# Patient Record
Sex: Female | Born: 1940 | Race: White | Hispanic: No | Marital: Married | Smoking: Never smoker
Health system: Southern US, Community
[De-identification: ages and names within clinical notes are randomized; demographics above are authoritative.]

## PROBLEM LIST (undated history)

## (undated) DIAGNOSIS — M1990 Unspecified osteoarthritis, unspecified site: Secondary | ICD-10-CM

## (undated) DIAGNOSIS — E039 Hypothyroidism, unspecified: Secondary | ICD-10-CM

## (undated) DIAGNOSIS — E119 Type 2 diabetes mellitus without complications: Secondary | ICD-10-CM

## (undated) HISTORY — PX: APPENDECTOMY: SHX54

## (undated) HISTORY — PX: COLONOSCOPY: SHX174

---

## 1982-01-06 HISTORY — PX: LAPAROSCOPY: SHX197

## 1998-10-25 ENCOUNTER — Encounter: Payer: Self-pay | Admitting: Family Medicine

## 1998-10-25 ENCOUNTER — Ambulatory Visit (HOSPITAL_COMMUNITY): Admission: RE | Admit: 1998-10-25 | Discharge: 1998-10-25 | Payer: Self-pay | Admitting: Family Medicine

## 1999-06-04 ENCOUNTER — Encounter: Admission: RE | Admit: 1999-06-04 | Discharge: 1999-06-04 | Payer: Self-pay | Admitting: Family Medicine

## 1999-06-04 ENCOUNTER — Encounter: Payer: Self-pay | Admitting: Family Medicine

## 2000-07-24 ENCOUNTER — Encounter: Payer: Self-pay | Admitting: Family Medicine

## 2000-07-24 ENCOUNTER — Encounter: Admission: RE | Admit: 2000-07-24 | Discharge: 2000-07-24 | Payer: Self-pay | Admitting: Family Medicine

## 2000-10-26 ENCOUNTER — Encounter: Payer: Self-pay | Admitting: Family Medicine

## 2000-10-26 ENCOUNTER — Encounter: Admission: RE | Admit: 2000-10-26 | Discharge: 2000-10-26 | Payer: Self-pay | Admitting: Family Medicine

## 2001-07-26 ENCOUNTER — Encounter: Admission: RE | Admit: 2001-07-26 | Discharge: 2001-07-26 | Payer: Self-pay | Admitting: Family Medicine

## 2001-07-26 ENCOUNTER — Encounter: Payer: Self-pay | Admitting: Family Medicine

## 2002-08-17 ENCOUNTER — Encounter: Payer: Self-pay | Admitting: Family Medicine

## 2002-08-17 ENCOUNTER — Encounter: Admission: RE | Admit: 2002-08-17 | Discharge: 2002-08-17 | Payer: Self-pay | Admitting: Family Medicine

## 2003-08-29 ENCOUNTER — Encounter: Admission: RE | Admit: 2003-08-29 | Discharge: 2003-08-29 | Payer: Self-pay | Admitting: Family Medicine

## 2004-02-01 ENCOUNTER — Other Ambulatory Visit: Admission: RE | Admit: 2004-02-01 | Discharge: 2004-02-01 | Payer: Self-pay | Admitting: Family Medicine

## 2004-09-16 ENCOUNTER — Encounter: Admission: RE | Admit: 2004-09-16 | Discharge: 2004-09-16 | Payer: Self-pay | Admitting: Family Medicine

## 2005-02-03 ENCOUNTER — Encounter: Admission: RE | Admit: 2005-02-03 | Discharge: 2005-02-03 | Payer: Self-pay | Admitting: Family Medicine

## 2005-09-19 ENCOUNTER — Encounter: Admission: RE | Admit: 2005-09-19 | Discharge: 2005-09-19 | Payer: Self-pay | Admitting: Family Medicine

## 2006-09-21 ENCOUNTER — Encounter: Admission: RE | Admit: 2006-09-21 | Discharge: 2006-09-21 | Payer: Self-pay | Admitting: Family Medicine

## 2007-02-08 ENCOUNTER — Other Ambulatory Visit: Admission: RE | Admit: 2007-02-08 | Discharge: 2007-02-08 | Payer: Self-pay | Admitting: Family Medicine

## 2007-10-04 ENCOUNTER — Encounter: Admission: RE | Admit: 2007-10-04 | Discharge: 2007-10-04 | Payer: Self-pay | Admitting: Family Medicine

## 2008-10-19 ENCOUNTER — Encounter: Admission: RE | Admit: 2008-10-19 | Discharge: 2008-10-19 | Payer: Self-pay | Admitting: Family Medicine

## 2008-10-20 ENCOUNTER — Encounter: Admission: RE | Admit: 2008-10-20 | Discharge: 2008-10-20 | Payer: Self-pay | Admitting: Family Medicine

## 2009-10-22 ENCOUNTER — Encounter: Admission: RE | Admit: 2009-10-22 | Discharge: 2009-10-22 | Payer: Self-pay | Admitting: Family Medicine

## 2010-02-21 ENCOUNTER — Other Ambulatory Visit: Payer: Self-pay | Admitting: Family Medicine

## 2010-02-21 DIAGNOSIS — R1031 Right lower quadrant pain: Secondary | ICD-10-CM

## 2010-02-22 ENCOUNTER — Ambulatory Visit
Admission: RE | Admit: 2010-02-22 | Discharge: 2010-02-22 | Disposition: A | Discharge status: Home / Self Care | Payer: MEDICARE | Source: Ambulatory Visit | Attending: Family Medicine | Admitting: Family Medicine

## 2010-02-22 DIAGNOSIS — R1031 Right lower quadrant pain: Secondary | ICD-10-CM

## 2010-02-22 MED ORDER — IOHEXOL 300 MG/ML  SOLN
100.0000 mL | Freq: Once | INTRAMUSCULAR | Status: AC | PRN
  Administered 2010-02-22: 100 mL via INTRAVENOUS

## 2010-10-09 ENCOUNTER — Other Ambulatory Visit: Payer: Self-pay | Admitting: Family Medicine

## 2010-10-09 DIAGNOSIS — Z1231 Encounter for screening mammogram for malignant neoplasm of breast: Secondary | ICD-10-CM

## 2010-10-24 ENCOUNTER — Ambulatory Visit
Admission: RE | Admit: 2010-10-24 | Discharge: 2010-10-24 | Disposition: A | Discharge status: Home / Self Care | Payer: Medicare Other | Source: Ambulatory Visit | Attending: Family Medicine | Admitting: Family Medicine

## 2010-10-24 DIAGNOSIS — Z1231 Encounter for screening mammogram for malignant neoplasm of breast: Secondary | ICD-10-CM

## 2011-02-24 ENCOUNTER — Other Ambulatory Visit (HOSPITAL_COMMUNITY)
Admission: RE | Admit: 2011-02-24 | Discharge: 2011-02-24 | Disposition: A | Discharge status: Home / Self Care | Payer: Medicare Other | Source: Ambulatory Visit | Attending: Family Medicine | Admitting: Family Medicine

## 2011-02-24 ENCOUNTER — Other Ambulatory Visit: Payer: Self-pay | Admitting: Family Medicine

## 2011-02-24 DIAGNOSIS — Z124 Encounter for screening for malignant neoplasm of cervix: Secondary | ICD-10-CM | POA: Insufficient documentation

## 2011-10-14 ENCOUNTER — Other Ambulatory Visit: Payer: Self-pay | Admitting: Family Medicine

## 2011-10-14 DIAGNOSIS — Z1231 Encounter for screening mammogram for malignant neoplasm of breast: Secondary | ICD-10-CM

## 2011-10-27 ENCOUNTER — Ambulatory Visit
Admission: RE | Admit: 2011-10-27 | Discharge: 2011-10-27 | Disposition: A | Discharge status: Home / Self Care | Payer: Medicare Other | Source: Ambulatory Visit | Attending: Family Medicine | Admitting: Family Medicine

## 2011-10-27 DIAGNOSIS — Z1231 Encounter for screening mammogram for malignant neoplasm of breast: Secondary | ICD-10-CM

## 2012-10-25 ENCOUNTER — Other Ambulatory Visit: Payer: Self-pay

## 2012-10-25 DIAGNOSIS — Z1231 Encounter for screening mammogram for malignant neoplasm of breast: Secondary | ICD-10-CM

## 2012-11-19 ENCOUNTER — Ambulatory Visit: Payer: Medicare Other

## 2012-11-23 ENCOUNTER — Ambulatory Visit
Admission: RE | Admit: 2012-11-23 | Discharge: 2012-11-23 | Disposition: A | Discharge status: Home / Self Care | Payer: Medicare Other | Source: Ambulatory Visit

## 2012-11-23 DIAGNOSIS — Z1231 Encounter for screening mammogram for malignant neoplasm of breast: Secondary | ICD-10-CM

## 2013-11-03 ENCOUNTER — Other Ambulatory Visit: Payer: Self-pay

## 2013-11-03 DIAGNOSIS — Z1239 Encounter for other screening for malignant neoplasm of breast: Secondary | ICD-10-CM

## 2013-11-24 ENCOUNTER — Ambulatory Visit
Admission: RE | Admit: 2013-11-24 | Discharge: 2013-11-24 | Disposition: A | Discharge status: Home / Self Care | Payer: Medicare Other | Source: Ambulatory Visit

## 2013-11-24 DIAGNOSIS — Z1239 Encounter for other screening for malignant neoplasm of breast: Secondary | ICD-10-CM

## 2014-10-25 ENCOUNTER — Other Ambulatory Visit: Payer: Self-pay

## 2014-10-25 DIAGNOSIS — Z1231 Encounter for screening mammogram for malignant neoplasm of breast: Secondary | ICD-10-CM

## 2014-11-27 ENCOUNTER — Ambulatory Visit
Admission: RE | Admit: 2014-11-27 | Discharge: 2014-11-27 | Disposition: A | Discharge status: Home / Self Care | Payer: Medicare Other | Source: Ambulatory Visit

## 2014-11-27 DIAGNOSIS — Z1231 Encounter for screening mammogram for malignant neoplasm of breast: Secondary | ICD-10-CM

## 2015-11-07 ENCOUNTER — Other Ambulatory Visit: Payer: Self-pay | Admitting: Family Medicine

## 2015-11-07 DIAGNOSIS — Z1231 Encounter for screening mammogram for malignant neoplasm of breast: Secondary | ICD-10-CM

## 2015-11-12 ENCOUNTER — Other Ambulatory Visit: Payer: Self-pay | Admitting: Orthopaedic Surgery

## 2015-11-15 ENCOUNTER — Encounter (HOSPITAL_COMMUNITY)
Admission: RE | Admit: 2015-11-15 | Discharge: 2015-11-15 | Disposition: A | Discharge status: Home / Self Care | Payer: Medicare Other | Source: Ambulatory Visit | Attending: Orthopaedic Surgery | Admitting: Orthopaedic Surgery

## 2015-11-15 ENCOUNTER — Encounter (HOSPITAL_COMMUNITY): Payer: Self-pay

## 2015-11-15 DIAGNOSIS — M4184 Other forms of scoliosis, thoracic region: Secondary | ICD-10-CM | POA: Insufficient documentation

## 2015-11-15 DIAGNOSIS — Z01818 Encounter for other preprocedural examination: Secondary | ICD-10-CM | POA: Insufficient documentation

## 2015-11-15 HISTORY — DX: Type 2 diabetes mellitus without complications: E11.9

## 2015-11-15 HISTORY — DX: Unspecified osteoarthritis, unspecified site: M19.90

## 2015-11-15 HISTORY — DX: Hypothyroidism, unspecified: E03.9

## 2015-11-15 LAB — GLUCOSE, CAPILLARY: GLUCOSE-CAPILLARY: 115 mg/dL — AB (ref 65–99)

## 2015-11-15 LAB — CBC WITH DIFFERENTIAL/PLATELET
Basophils Absolute: 0 10*3/uL (ref 0.0–0.1)
Basophils Relative: 1 %
Eosinophils Absolute: 0.2 10*3/uL (ref 0.0–0.7)
Eosinophils Relative: 2 %
HEMATOCRIT: 39.6 % (ref 36.0–46.0)
HEMOGLOBIN: 13.2 g/dL (ref 12.0–15.0)
LYMPHS ABS: 3.1 10*3/uL (ref 0.7–4.0)
Lymphocytes Relative: 40 %
MCH: 28.6 pg (ref 26.0–34.0)
MCHC: 33.3 g/dL (ref 30.0–36.0)
MCV: 85.7 fL (ref 78.0–100.0)
MONO ABS: 0.5 10*3/uL (ref 0.1–1.0)
MONOS PCT: 7 %
NEUTROS ABS: 3.9 10*3/uL (ref 1.7–7.7)
NEUTROS PCT: 50 %
Platelets: 282 10*3/uL (ref 150–400)
RBC: 4.62 MIL/uL (ref 3.87–5.11)
RDW: 14.2 % (ref 11.5–15.5)
WBC: 7.7 10*3/uL (ref 4.0–10.5)

## 2015-11-15 LAB — BASIC METABOLIC PANEL
ANION GAP: 7 (ref 5–15)
BUN: 13 mg/dL (ref 6–20)
CALCIUM: 9.7 mg/dL (ref 8.9–10.3)
CHLORIDE: 102 mmol/L (ref 101–111)
CO2: 26 mmol/L (ref 22–32)
CREATININE: 0.69 mg/dL (ref 0.44–1.00)
GFR calc non Af Amer: >60 mL/min (ref >60)
GLUCOSE: 97 mg/dL (ref 65–99)
Potassium: 3.8 mmol/L (ref 3.5–5.1)
Sodium: 135 mmol/L (ref 135–145)

## 2015-11-15 LAB — PROTIME-INR
INR: 0.99
Prothrombin Time: 13.1 seconds (ref 11.4–15.2)

## 2015-11-15 LAB — SURGICAL PCR SCREEN
MRSA, PCR: NEGATIVE
STAPHYLOCOCCUS AUREUS: NEGATIVE

## 2015-11-15 LAB — URINALYSIS, ROUTINE W REFLEX MICROSCOPIC
Bilirubin Urine: NEGATIVE
Glucose, UA: NEGATIVE mg/dL
Hgb urine dipstick: NEGATIVE
KETONES UR: NEGATIVE mg/dL
LEUKOCYTES UA: NEGATIVE
NITRITE: NEGATIVE
PROTEIN: NEGATIVE mg/dL
Specific Gravity, Urine: 1.008 (ref 1.005–1.030)
pH: 6.5 (ref 5.0–8.0)

## 2015-11-15 LAB — TYPE AND SCREEN
ABO/RH(D): O NEG
Antibody Screen: NEGATIVE

## 2015-11-15 LAB — APTT: aPTT: 27 seconds (ref 24–36)

## 2015-11-15 LAB — ABO/RH: ABO/RH(D): O NEG

## 2015-11-15 NOTE — Pre-Procedure Instructions (Addendum)
Danielle Fields  11/15/2015      [id number] - [address], [place] - [address] [name] [name] [address] Phone: [phone] Fax: [phone]    Your procedure is scheduled on Tues. Nov. 21  [facility]:30 [facility].M.  Call this number if you have problems the morning of surgery:  [phone]   Remember:  Do not eat food or drink liquids after midnight on Mon. Nov. 20  Take these medicines the morning of surgery with A SIP OF WATER: levothyroxine (synthroid)              Stop 1 week prior to surgery: aspirin, advil, motrin, ibubrofen,fish oil, vitamins/herbal medicines.    How to Manage Your Diabetes Before and After Surgery  Why is it important to control my blood sugar before and after surgery? . Improving blood sugar levels before and after surgery helps healing and can limit problems. . A way of improving blood sugar control is eating a healthy diet by: o  Eating less sugar and carbohydrates o  Increasing activity/exercise o  Talking with your doctor about reaching your blood sugar goals . High blood sugars (greater than 180 mg/dL) can raise your risk of infections and slow your recovery, so you will need to focus on controlling your diabetes during the weeks before surgery. . Make sure that the doctor who takes care of your diabetes knows about your planned surgery including the date and location.  How do I manage my blood sugar before surgery? . Check your blood sugar at least 4 times a day, starting 2 days before surgery, to make sure that the level is not too high or low. o Check your blood sugar the morning of your surgery when you wake up and every 2 hours until you get to the Short Stay unit. . If your blood sugar is less than 70 mg/dL, you will need to treat for low blood sugar: o Do not take insulin. o Treat a low blood sugar (less than 70 mg/dL) with  cup of clear juice (cranberry or apple),  4 glucose tablets, OR glucose gel. o Recheck blood sugar in 15 minutes after treatment (to make sure it is greater than 70 mg/dL). If your blood sugar is not greater than 70 mg/dL on recheck, call [phone] for further instructions. . Report your blood sugar to the short stay nurse when you get to Short Stay.  . If you are admitted to the hospital after surgery: o Your blood sugar will be checked by the staff and you will probably be given insulin after surgery (instead of oral diabetes medicines) to make sure you have good blood sugar levels. o The goal for blood sugar control after surgery is 80-180 mg/dL.    Do not wear jewelry, make-up or nail polish.  Do not wear lotions, powders, or perfumes, or deoderant.  Do not shave 48 hours prior to surgery.  Men may shave face and neck.  Do not bring valuables to the hospital.  [facility] is not responsible for any belongings or valuables.  Contacts, dentures or bridgework may not be worn into surgery.  Leave your suitcase in the car.  After surgery it may be brought to your room.  For patients admitted to the hospital, discharge time will be determined by your treatment team.  Patients discharged the day of surgery will not be allowed to drive home.  Special instructions: review preparing for surgery  Please read over the following fact sheets that you were given. Coughing and Deep Breathing and MRSA Information

## 2015-11-15 NOTE — Progress Notes (Signed)
PCP:dr. Blair Heysobert Ehinger @ [facility]-- will request Hgb A1C  Fasting sugars 100

## 2015-11-19 NOTE — Progress Notes (Addendum)
Anesthesia Chart Review: Patient is a 75 year old female scheduled for right THA, anterior approach on 11/27/15 by Dr. Jerl Santosalldorf. Case is posted for spinal.  History includes non-smoker, DM2, hypothyroidism, appendectomy. Family history of CAD (mother, brother, sister).  PCP is Dr. Blair Heysobert Ehinger, last visit 10/24/15.  Meds include ASA 81 mg, Calcium, Vitamin D, levothyroxine, omega-3 fatty acids, Livalo.   BP (!) 142/74   Pulse 77   Temp 36.7 C   Resp 20   Ht 5\' 5"  (1.651 m)   Wt 136 lb 11.2 oz (62 kg)   SpO2 95%   BMI 22.75 kg/m   11/15/15 EKG: NSR, ST/T wave abnormality, consider anterior ischemia. There are no comparison tracings in [facility] Epic, Muse, or at Dr. Randel BooksEhinger's office.   11/15/15 CXR: FINDINGS: Cardiomediastinal silhouette is unremarkable. Dextroscoliosis of thoracic spine. Mild degenerative changes thoracic spine. No infiltrate or pulmonary edema. IMPRESSION: No active cardiopulmonary disease.  Thoracic dextroscoliosis.  Preoperative labs noted. UA WNL. A1c on 10/26/15 (PCP) was 6.0.  Discussed above with anesthesiologist Dr. Sandford Craze. Jackson. With abnormal EKG, DM and family history of CAD would recommend preoperative cardiology evaluation. Olegario MessierKathy at Dr. Nolon Nationsalldorf's office notified.  Velna Ochsllison Elaya Droege, PA-C [facility]/Anesthesiology Phone [phone] 11/19/2015 4:46 PM   Addendum: Patient was seen by Dr. Eden EmmsNishan for preoperative cardiology evaluation on 11/22/15. He wrote, "Abnormal ECG doubt ischemia as she has never had vascular disease and has No exertional symptoms No need for stress testing However will get echo today To r/o Non obstructive or apical HOCM.Marland Kitchen.Marland Kitchen.Hip clear to have right THR via anterior approach with Dr Jerl Santosalldorf Tuesday." Echo was unremarkable--no HOCM noted by report. He apparently was able to get an old EKG tracing from 1998 that showed SR, PAC, no biphasic anterolateral T waves. PRN cardiology follow-up recommended.  11/22/15 Echo: Study  Conclusions - Left ventricle: The cavity size was at the upper limits of   normal. Wall thickness was normal. Systolic function was normal.   The estimated ejection fraction was in the range of 55% to 60%.   Wall motion was normal; there were no regional wall motion   abnormalities. Doppler parameters are consistent with abnormal   left ventricular relaxation (grade 1 diastolic dysfunction). - Left atrium: The atrium was mildly dilated.  Velna Ochsllison Kaitlin Alcindor, PA-C [facility]/Anesthesiology Phone [phone] 11/23/2015 10:17 AM

## 2015-11-21 NOTE — Progress Notes (Signed)
Cardiology Office Note   Date:  11/22/2015   ID:  Danielle Fields, DOB [birth year], MRN [id number]  PCP:  Thora LanceEHINGER,ROBERT R, MD  Cardiologist:   Charlton HawsPeter Jonte Wollam, MD   Chief Complaint  Patient presents with  . Establish Care      History of Present Illness: Danielle Fields is a 75 y.o. female who presents for preop clearance. Needs right total hip with Dr Jerl Santosalldorf.  Preop ECG abnormal Done on 11/15/15 Biphasic T waves in precordium read by computer as anterolateral ischemia.  No old ECG to compare Diet controlled DM and elevated lipids on statin .     CXR 11/9 scoliosis no CE normal mediastinum   She is retired from [employer] Still active goes to [employer] 3x/week No chest pain Palpitations dyspnea or syncope Does not think Dr Manus GunningEhinger has done an ECG on her In years/ever. No family history of HOCM She has not had resistant HTN.  No history  Of connective tissue disease.    Past Medical History:  Diagnosis Date  . Arthritis   . Diabetes mellitus without complication (HCC)   . Hypothyroidism     Past Surgical History:  Procedure Laterality Date  . APPENDECTOMY    . COLONOSCOPY     x2  . LAPAROSCOPY  1984     Current Outpatient Prescriptions  Medication Sig Dispense Refill  . aspirin EC 81 MG tablet Take 81 mg by mouth daily.    Marland Kitchen. CALCIUM PO Take 1 capsule by mouth 2 (two) times daily.    . cholecalciferol (VITAMIN D) 1000 units tablet Take 1,000 Units by mouth daily.    Marland Kitchen. co-enzyme Q-10 30 MG capsule Take 30 mg by mouth daily.    Marland Kitchen. levothyroxine (SYNTHROID, LEVOTHROID) 100 MCG tablet Take 1 tablet by mouth daily.    . Omega-3 Fatty Acids (FISH OIL PO) Take 1 capsule by mouth daily.    . Pitavastatin Calcium (LIVALO) 4 MG TABS Take 2 mg by mouth daily.     No current facility-administered medications for this visit.     Allergies:   Sulfa antibiotics    Social History:  The patient  reports that she has never smoked. She has never used smokeless tobacco.  She reports that she does not drink alcohol or use drugs.   Family History:  The patient's family history includes Heart failure in her mother.    ROS:  Please see the history of present illness.   Otherwise, review of systems are positive for none.   All other systems are reviewed and negative.    PHYSICAL EXAM: VS:  BP (!) 150/80   Pulse 94   Ht 5\' 5"  (1.651 m)   Wt 62.1 kg (136 lb 12.8 oz)   SpO2 98%   BMI 22.76 kg/m  , BMI Body mass index is 22.76 kg/m. Affect appropriate Healthy:  appears stated age HEENT: normal Neck supple with no adenopathy JVP normal no bruits no thyromegaly Lungs clear with no wheezing and good diaphragmatic motion Heart:  S1/S2 no murmur, no rub, gallop or click PMI normal Abdomen: benighn, BS positve, no tenderness, no AAA no bruit.  No HSM or HJR Distal pulses intact with no bruits No edema Neuro non-focal Skin warm and dry No muscular weakness    EKG:  SR biphasic T waves precordium    Recent Labs: 11/15/2015: BUN 13; Creatinine, Ser 0.69; Hemoglobin 13.2; Platelets 282; Potassium 3.8; Sodium 135    Lipid Panel No results  found for: CHOL, TRIG, HDL, CHOLHDL, VLDL, LDLCALC, LDLDIRECT    Wt Readings from Last 3 Encounters:  11/22/15 62.1 kg (136 lb 12.8 oz)  11/15/15 62 kg (136 lb 11.2 oz)      Other studies Reviewed: Additional studies/ records that were reviewed today include: Notes Dr Jerl Santosalldorf and Ehinger ECG and echo .    ASSESSMENT AND PLAN:  1. Abnormal ECG doubt ischemia as she has never had vascular disease and has No exertional symptoms No need for stress testing However will get echo today To r/o Non obstructive or apical HOCM. 2. Chol on statin labs with primary 3. Thyroid continue replacement TSH normal 4. Hip clear to have right THR via anterior approach with Dr Jerl Santosalldorf Tuesday    Current medicines are reviewed at length with the patient today.  The patient does not have concerns regarding medicines.  The  following changes have been made:  no change  Labs/ tests ordered today include: Echo   Orders Placed This Encounter  Procedures  . ECHOCARDIOGRAM COMPLETE     Disposition:   FU with us PRN    Addendum ECG from 1998  Reviewed SR PAC no biphasic anterolateral T waves    Signed, Charlton HawsPeter Angelee Bahr, MD  11/22/2015 9:40 AM    [facility] [address], [place], [place]  [address] Phone: [phone]) [phone]; Fax: [phone]) [phone]

## 2015-11-22 ENCOUNTER — Encounter: Payer: Self-pay | Admitting: Cardiovascular Disease

## 2015-11-22 ENCOUNTER — Other Ambulatory Visit: Payer: Self-pay

## 2015-11-22 ENCOUNTER — Ambulatory Visit (INDEPENDENT_AMBULATORY_CARE_PROVIDER_SITE_OTHER): Payer: Medicare Other | Admitting: Cardiovascular Disease

## 2015-11-22 ENCOUNTER — Ambulatory Visit (HOSPITAL_COMMUNITY): Payer: Medicare Other | Attending: Cardiology

## 2015-11-22 VITALS — BP 150/80 | HR 94 | Ht 65.0 in | Wt 136.8 lb

## 2015-11-22 DIAGNOSIS — Z8249 Family history of ischemic heart disease and other diseases of the circulatory system: Secondary | ICD-10-CM | POA: Diagnosis not present

## 2015-11-22 DIAGNOSIS — R9431 Abnormal electrocardiogram [ECG] [EKG]: Secondary | ICD-10-CM

## 2015-11-22 DIAGNOSIS — Z7689 Persons encountering health services in other specified circumstances: Secondary | ICD-10-CM | POA: Diagnosis not present

## 2015-11-22 DIAGNOSIS — E119 Type 2 diabetes mellitus without complications: Secondary | ICD-10-CM | POA: Diagnosis not present

## 2015-11-22 LAB — ECHOCARDIOGRAM COMPLETE
HEIGHTINCHES: 65 in
Weight: 2188.8 oz

## 2015-11-22 NOTE — H&P (Signed)
TOTAL HIP ADMISSION H&P  Patient is admitted for right total hip arthroplasty.  Subjective:  Chief Complaint: right hip pain  HPI: Danielle Fields, 75 y.o. female, has a history of pain and functional disability in the right hip(s) due to arthritis and patient has failed non-surgical conservative treatments for greater than 12 weeks to include NSAID's and/or analgesics, corticosteriod injections, flexibility and strengthening excercises, use of assistive devices, weight reduction as appropriate and activity modification.  Onset of symptoms was gradual starting 5 years ago with gradually worsening course since that time.The patient noted no past surgery on the right hip(s).  Patient currently rates pain in the right hip at 10 out of 10 with activity. Patient has night pain, worsening of pain with activity and weight bearing, trendelenberg gait, pain that interfers with activities of daily living and crepitus. Patient has evidence of subchondral cysts, subchondral sclerosis, periarticular osteophytes and joint space narrowing by imaging studies. This condition presents safety issues increasing the risk of falls. There is no current active infection.  There are no active problems to display for this patient.  Past Medical History:  Diagnosis Date  . Arthritis   . Diabetes mellitus without complication (HCC)   . Hypothyroidism     Past Surgical History:  Procedure Laterality Date  . APPENDECTOMY    . COLONOSCOPY     x2  . LAPAROSCOPY  1984    No prescriptions prior to admission.   Allergies  Allergen Reactions  . Sulfa Antibiotics Hives    Social History  Substance Use Topics  . Smoking status: Never Smoker  . Smokeless tobacco: Never Used  . Alcohol use No    Family History  Problem Relation Age of Onset  . Heart failure Mother      Review of Systems  Musculoskeletal: Positive for joint pain.       Right hip  All other systems reviewed and are  negative.   Objective:  Physical Exam  Constitutional: She is oriented to person, place, and time. She appears well-developed and well-nourished.  HENT:  Head: Normocephalic and atraumatic.  Eyes: Pupils are equal, round, and reactive to light.  Neck: Normal range of motion.  Cardiovascular: Normal rate and regular rhythm.   Respiratory: Effort normal.  GI: Soft.  Musculoskeletal:  Right hip motion is quite limited. She has pain on internal rotation. Leg lengths look about equal. She walks with a markedly altered gait. Sensation and motor function are intact in her feet with palpable pulses on both sides. She has no scars about her back and abdominal exam is benign. Sensation and motor function are intact in her feet with palpable pulses on both sides.   Neurological: She is alert and oriented to person, place, and time.  Skin: Skin is warm and dry.  Psychiatric: She has a normal mood and affect. Her behavior is normal. Judgment and thought content normal.    Vital signs in last 24 hours: Pulse Rate:  [94] 94 (11/16 0857) BP: (150)/(80) 150/80 (11/16 0857) SpO2:  [98 %] 98 % (11/16 0857) Weight:  [62.1 kg (136 lb 12.8 oz)] 62.1 kg (136 lb 12.8 oz) (11/16 0857)  Labs:   Estimated body mass index is 22.76 kg/m as calculated from the following:   Height as of 11/22/15: 5\' 5"  (1.651 m).   Weight as of 11/22/15: 62.1 kg (136 lb 12.8 oz).   Imaging Review Plain radiographs demonstrate severe degenerative joint disease of the right hip(s). The bone quality appears to  be good for age and reported activity level.  Assessment/Plan:  End stage primary arthritis, right hip(s)  The patient history, physical examination, clinical judgement of the provider and imaging studies are consistent with end stage degenerative joint disease of the right hip(s) and total hip arthroplasty is deemed medically necessary. The treatment options including medical management, injection therapy,  arthroscopy and arthroplasty were discussed at length. The risks and benefits of total hip arthroplasty were presented and reviewed. The risks due to aseptic loosening, infection, stiffness, dislocation/subluxation,  thromboembolic complications and other imponderables were discussed.  The patient acknowledged the explanation, agreed to proceed with the plan and consent was signed. Patient is being admitted for inpatient treatment for surgery, pain control, PT, OT, prophylactic antibiotics, VTE prophylaxis, progressive ambulation and ADL's and discharge planning.The patient is planning to be discharged home with home health services

## 2015-11-22 NOTE — Patient Instructions (Addendum)
Medication Instructions:  Your physician recommends that you continue on your current medications as directed. Please refer to the Current Medication list given to you today.  Labwork: NONE  Testing/Procedures: Your physician has requested that you have an echocardiogram. Echocardiography is a painless test that uses sound waves to create images of your heart. It provides your doctor with information about the size and shape of your heart and how well your heart's chambers and valves are working. This procedure takes approximately one hour. There are no restrictions for this procedure.  Follow-Up: Your physician wants you to follow-up with Dr. Nishan as needed.   If you need a refill on your cardiac medications before your next appointment, please call your pharmacy.    

## 2015-11-23 ENCOUNTER — Telehealth: Payer: Self-pay | Admitting: Cardiovascular Disease

## 2015-11-23 NOTE — Telephone Encounter (Signed)
Follow Up:    Pt wants to know if Dr Eden EmmsNishan cleared her for surgery and have he sent the clearance to Dr Marcene CorningPeter Dalldorf?

## 2015-11-23 NOTE — Telephone Encounter (Signed)
Called patient back. Informed patient that Dr. Fabio BeringNishan's office note was sent to Dr. Jerl Santosalldorf. Informed patient that her echo was complete, and Dr. Eden EmmsNishan would be reviewing it before her surgery and will forward results to Dr. Jerl Santosalldorf. Patient verbalized understanding.

## 2015-11-25 NOTE — Telephone Encounter (Signed)
Fine for surgery with Dr Jerl Santosalldorf  Echo is normal

## 2015-11-27 ENCOUNTER — Inpatient Hospital Stay (HOSPITAL_COMMUNITY)
Admission: RE | Admit: 2015-11-27 | Discharge: 2015-11-29 | DRG: 470 | Disposition: A | Discharge status: Home Health | Payer: Medicare Other | Source: Ambulatory Visit | Attending: Orthopaedic Surgery | Admitting: Orthopaedic Surgery

## 2015-11-27 ENCOUNTER — Encounter (HOSPITAL_COMMUNITY)
Admission: RE | Disposition: A | Discharge status: Home Health | Payer: Self-pay | Source: Ambulatory Visit | Attending: Orthopaedic Surgery

## 2015-11-27 ENCOUNTER — Inpatient Hospital Stay (HOSPITAL_COMMUNITY): Payer: Medicare Other | Admitting: Vascular Surgery

## 2015-11-27 ENCOUNTER — Encounter (HOSPITAL_COMMUNITY): Payer: Self-pay | Admitting: Surgery

## 2015-11-27 ENCOUNTER — Inpatient Hospital Stay (HOSPITAL_COMMUNITY): Payer: Medicare Other | Admitting: Anesthesiology

## 2015-11-27 ENCOUNTER — Inpatient Hospital Stay (HOSPITAL_COMMUNITY): Payer: Medicare Other

## 2015-11-27 DIAGNOSIS — M1611 Unilateral primary osteoarthritis, right hip: Principal | ICD-10-CM | POA: Diagnosis present

## 2015-11-27 DIAGNOSIS — E039 Hypothyroidism, unspecified: Secondary | ICD-10-CM | POA: Diagnosis present

## 2015-11-27 DIAGNOSIS — Z8249 Family history of ischemic heart disease and other diseases of the circulatory system: Secondary | ICD-10-CM | POA: Diagnosis not present

## 2015-11-27 DIAGNOSIS — R110 Nausea: Secondary | ICD-10-CM | POA: Diagnosis not present

## 2015-11-27 DIAGNOSIS — M25551 Pain in right hip: Secondary | ICD-10-CM | POA: Diagnosis present

## 2015-11-27 DIAGNOSIS — T40605A Adverse effect of unspecified narcotics, initial encounter: Secondary | ICD-10-CM | POA: Diagnosis not present

## 2015-11-27 DIAGNOSIS — E119 Type 2 diabetes mellitus without complications: Secondary | ICD-10-CM | POA: Diagnosis not present

## 2015-11-27 DIAGNOSIS — Z419 Encounter for procedure for purposes other than remedying health state, unspecified: Secondary | ICD-10-CM

## 2015-11-27 HISTORY — PX: TOTAL HIP ARTHROPLASTY: SHX124

## 2015-11-27 LAB — GLUCOSE, CAPILLARY
GLUCOSE-CAPILLARY: 111 mg/dL — AB (ref 65–99)
Glucose-Capillary: 159 mg/dL — ABNORMAL HIGH (ref 65–99)

## 2015-11-27 SURGERY — ARTHROPLASTY, HIP, TOTAL, ANTERIOR APPROACH
Anesthesia: Monitor Anesthesia Care | Laterality: Right

## 2015-11-27 MED ORDER — HYDROMORPHONE HCL 2 MG/ML IJ SOLN: 0.5000 mg | INTRAMUSCULAR | Status: DC | PRN

## 2015-11-27 MED ORDER — ONDANSETRON HCL 4 MG/2ML IJ SOLN
INTRAMUSCULAR | Status: DC | PRN
  Administered 2015-11-27: 4 mg via INTRAVENOUS

## 2015-11-27 MED ORDER — BUPIVACAINE HCL (PF) 0.5 % IJ SOLN
INTRAMUSCULAR | Status: DC | PRN
  Administered 2015-11-27: 20 mL

## 2015-11-27 MED ORDER — METHOCARBAMOL 500 MG PO TABS
500.0000 mg | ORAL_TABLET | Freq: Four times a day (QID) | ORAL | Status: DC | PRN
  Administered 2015-11-27: 500 mg via ORAL
  Filled 2015-11-27 (×2): qty 1

## 2015-11-27 MED ORDER — ONDANSETRON HCL 4 MG PO TABS: 4.0000 mg | ORAL_TABLET | Freq: Four times a day (QID) | ORAL | Status: DC | PRN

## 2015-11-27 MED ORDER — ONDANSETRON HCL 4 MG/2ML IJ SOLN
4.0000 mg | Freq: Four times a day (QID) | INTRAMUSCULAR | Status: DC | PRN
  Administered 2015-11-27: 4 mg via INTRAVENOUS
  Filled 2015-11-27: qty 2

## 2015-11-27 MED ORDER — CEFAZOLIN SODIUM-DEXTROSE 2-4 GM/100ML-% IV SOLN
INTRAVENOUS | Status: AC
  Filled 2015-11-27: qty 100

## 2015-11-27 MED ORDER — SODIUM CHLORIDE 0.9 % IV SOLN
1000.0000 mg | INTRAVENOUS | Status: AC
  Administered 2015-11-27: 1000 mg via INTRAVENOUS
  Filled 2015-11-27: qty 10

## 2015-11-27 MED ORDER — EPINEPHRINE PF 1 MG/ML IJ SOLN
INTRAMUSCULAR | Status: DC | PRN
  Administered 2015-11-27: .15 mL

## 2015-11-27 MED ORDER — FENTANYL CITRATE (PF) 100 MCG/2ML IJ SOLN
INTRAMUSCULAR | Status: AC
  Filled 2015-11-27: qty 2

## 2015-11-27 MED ORDER — METOCLOPRAMIDE HCL 5 MG PO TABS: 5.0000 mg | ORAL_TABLET | Freq: Three times a day (TID) | ORAL | Status: DC | PRN

## 2015-11-27 MED ORDER — METOCLOPRAMIDE HCL 5 MG/ML IJ SOLN: 5.0000 mg | Freq: Three times a day (TID) | INTRAMUSCULAR | Status: DC | PRN

## 2015-11-27 MED ORDER — OXYCODONE HCL 5 MG PO TABS
ORAL_TABLET | ORAL | Status: AC
  Filled 2015-11-27: qty 1

## 2015-11-27 MED ORDER — ASPIRIN EC 325 MG PO TBEC
325.0000 mg | DELAYED_RELEASE_TABLET | Freq: Two times a day (BID) | ORAL | Status: DC
  Administered 2015-11-28 – 2015-11-29 (×3): 325 mg via ORAL
  Filled 2015-11-27 (×3): qty 1

## 2015-11-27 MED ORDER — BISACODYL 5 MG PO TBEC: 5.0000 mg | DELAYED_RELEASE_TABLET | Freq: Every day | ORAL | Status: DC | PRN

## 2015-11-27 MED ORDER — OXYCODONE HCL 5 MG/5ML PO SOLN: 5.0000 mg | Freq: Once | ORAL | Status: AC | PRN

## 2015-11-27 MED ORDER — OXYCODONE HCL 5 MG PO TABS
5.0000 mg | ORAL_TABLET | Freq: Once | ORAL | Status: AC | PRN
  Administered 2015-11-27: 5 mg via ORAL

## 2015-11-27 MED ORDER — PHENYLEPHRINE HCL 10 MG/ML IJ SOLN
INTRAVENOUS | Status: DC | PRN
  Administered 2015-11-27: 50 ug/min via INTRAVENOUS

## 2015-11-27 MED ORDER — PROPOFOL 1000 MG/100ML IV EMUL
INTRAVENOUS | Status: AC
  Filled 2015-11-27: qty 100

## 2015-11-27 MED ORDER — ONDANSETRON HCL 4 MG/2ML IJ SOLN: 4.0000 mg | Freq: Once | INTRAMUSCULAR | Status: DC | PRN

## 2015-11-27 MED ORDER — EPINEPHRINE PF 1 MG/ML IJ SOLN
INTRAMUSCULAR | Status: AC
  Filled 2015-11-27: qty 1

## 2015-11-27 MED ORDER — LEVOTHYROXINE SODIUM 100 MCG PO TABS
100.0000 ug | ORAL_TABLET | Freq: Every day | ORAL | Status: DC
  Administered 2015-11-28 – 2015-11-29 (×2): 100 ug via ORAL
  Filled 2015-11-27 (×2): qty 1

## 2015-11-27 MED ORDER — CEFAZOLIN SODIUM-DEXTROSE 2-4 GM/100ML-% IV SOLN
2.0000 g | Freq: Four times a day (QID) | INTRAVENOUS | Status: AC
  Administered 2015-11-27 (×2): 2 g via INTRAVENOUS
  Filled 2015-11-27 (×2): qty 100

## 2015-11-27 MED ORDER — ACETAMINOPHEN 650 MG RE SUPP: 650.0000 mg | Freq: Four times a day (QID) | RECTAL | Status: DC | PRN

## 2015-11-27 MED ORDER — ACETAMINOPHEN 325 MG PO TABS
650.0000 mg | ORAL_TABLET | Freq: Four times a day (QID) | ORAL | Status: DC | PRN
  Administered 2015-11-28: 650 mg via ORAL
  Filled 2015-11-27: qty 2

## 2015-11-27 MED ORDER — LACTATED RINGERS IV SOLN
INTRAVENOUS | Status: DC
  Administered 2015-11-27 – 2015-11-28 (×2): via INTRAVENOUS

## 2015-11-27 MED ORDER — DOCUSATE SODIUM 100 MG PO CAPS
100.0000 mg | ORAL_CAPSULE | Freq: Two times a day (BID) | ORAL | Status: DC
  Administered 2015-11-27 – 2015-11-29 (×4): 100 mg via ORAL
  Filled 2015-11-27 (×4): qty 1

## 2015-11-27 MED ORDER — LACTATED RINGERS IV SOLN
INTRAVENOUS | Status: DC | PRN
  Administered 2015-11-27: 10:00:00 via INTRAVENOUS

## 2015-11-27 MED ORDER — ONDANSETRON HCL 4 MG/2ML IJ SOLN
INTRAMUSCULAR | Status: AC
  Filled 2015-11-27: qty 2

## 2015-11-27 MED ORDER — PROPOFOL 500 MG/50ML IV EMUL
INTRAVENOUS | Status: DC | PRN
  Administered 2015-11-27: 75 ug/kg/min via INTRAVENOUS

## 2015-11-27 MED ORDER — PROPOFOL 10 MG/ML IV BOLUS
INTRAVENOUS | Status: AC
  Filled 2015-11-27: qty 20

## 2015-11-27 MED ORDER — PHENOL 1.4 % MT LIQD
1.0000 | OROMUCOSAL | Status: DC | PRN
Start: 2015-11-27 — End: 2015-11-29

## 2015-11-27 MED ORDER — FENTANYL CITRATE (PF) 100 MCG/2ML IJ SOLN
INTRAMUSCULAR | Status: DC | PRN
  Administered 2015-11-27: 25 ug via INTRAVENOUS
  Administered 2015-11-27: 50 ug via INTRAVENOUS
  Administered 2015-11-27: 25 ug via INTRAVENOUS

## 2015-11-27 MED ORDER — TRANEXAMIC ACID 1000 MG/10ML IV SOLN
1000.0000 mg | Freq: Once | INTRAVENOUS | Status: AC
  Administered 2015-11-27: 1000 mg via INTRAVENOUS
  Filled 2015-11-27: qty 10

## 2015-11-27 MED ORDER — LACTATED RINGERS IV SOLN
INTRAVENOUS | Status: DC
  Administered 2015-11-27: 08:00:00 via INTRAVENOUS

## 2015-11-27 MED ORDER — 0.9 % SODIUM CHLORIDE (POUR BTL) OPTIME
TOPICAL | Status: DC | PRN
  Administered 2015-11-27: 1000 mL

## 2015-11-27 MED ORDER — BUPIVACAINE HCL (PF) 0.5 % IJ SOLN
INTRAMUSCULAR | Status: AC
  Filled 2015-11-27: qty 30

## 2015-11-27 MED ORDER — MIDAZOLAM HCL 5 MG/5ML IJ SOLN
INTRAMUSCULAR | Status: DC | PRN
  Administered 2015-11-27: 2 mg via INTRAVENOUS

## 2015-11-27 MED ORDER — BUPIVACAINE LIPOSOME 1.3 % IJ SUSP
20.0000 mL | INTRAMUSCULAR | Status: AC
  Administered 2015-11-27: 20 mL
  Filled 2015-11-27: qty 20

## 2015-11-27 MED ORDER — METHOCARBAMOL 1000 MG/10ML IJ SOLN
500.0000 mg | Freq: Four times a day (QID) | INTRAVENOUS | Status: DC | PRN
  Filled 2015-11-27: qty 5

## 2015-11-27 MED ORDER — DIPHENHYDRAMINE HCL 12.5 MG/5ML PO ELIX: 12.5000 mg | ORAL_SOLUTION | ORAL | Status: DC | PRN

## 2015-11-27 MED ORDER — ALUM & MAG HYDROXIDE-SIMETH 200-200-20 MG/5ML PO SUSP: 30.0000 mL | ORAL | Status: DC | PRN

## 2015-11-27 MED ORDER — HYDROCODONE-ACETAMINOPHEN 5-325 MG PO TABS
1.0000 | ORAL_TABLET | ORAL | Status: DC | PRN
  Administered 2015-11-27 (×2): 1 via ORAL
  Filled 2015-11-27 (×2): qty 1

## 2015-11-27 MED ORDER — MENTHOL 3 MG MT LOZG: 1.0000 | LOZENGE | OROMUCOSAL | Status: DC | PRN

## 2015-11-27 MED ORDER — CEFAZOLIN SODIUM-DEXTROSE 2-4 GM/100ML-% IV SOLN
2.0000 g | INTRAVENOUS | Status: AC
  Administered 2015-11-27: 2 g via INTRAVENOUS

## 2015-11-27 MED ORDER — MIDAZOLAM HCL 2 MG/2ML IJ SOLN
INTRAMUSCULAR | Status: AC
  Filled 2015-11-27: qty 2

## 2015-11-27 MED ORDER — CHLORHEXIDINE GLUCONATE 4 % EX LIQD: 60.0000 mL | Freq: Once | CUTANEOUS | Status: DC

## 2015-11-27 MED ORDER — FENTANYL CITRATE (PF) 100 MCG/2ML IJ SOLN
25.0000 ug | INTRAMUSCULAR | Status: DC | PRN
  Administered 2015-11-27 (×4): 25 ug via INTRAVENOUS

## 2015-11-27 MED ORDER — TRANEXAMIC ACID 1000 MG/10ML IV SOLN
2000.0000 mg | INTRAVENOUS | Status: AC
  Administered 2015-11-27: 2000 mg via TOPICAL
  Filled 2015-11-27: qty 20

## 2015-11-27 SURGICAL SUPPLY — 56 items
BLADE SAW SGTL 18X1.27X75 (BLADE) ×2 IMPLANT
BLADE SAW SGTL 18X1.27X75MM (BLADE) ×1
BLADE SURG ROTATE 9660 (MISCELLANEOUS) IMPLANT
CAPT HIP TOTAL 2 ×2 IMPLANT
CELLS DAT CNTRL 66122 CELL SVR (MISCELLANEOUS) ×1 IMPLANT
CLOSURE STERI-STRIP 1/2X4 (GAUZE/BANDAGES/DRESSINGS) ×1
CLSR STERI-STRIP ANTIMIC 1/2X4 (GAUZE/BANDAGES/DRESSINGS) ×1 IMPLANT
COVER PERINEAL POST (MISCELLANEOUS) ×3 IMPLANT
COVER SURGICAL LIGHT HANDLE (MISCELLANEOUS) ×3 IMPLANT
DRAPE C-ARM 42X72 X-RAY (DRAPES) ×3 IMPLANT
DRAPE IMP U-DRAPE 54X76 (DRAPES) ×3 IMPLANT
DRAPE STERI IOBAN 125X83 (DRAPES) ×3 IMPLANT
DRAPE U-SHAPE 47X51 STRL (DRAPES) ×9 IMPLANT
DRSG AQUACEL AG ADV 3.5X10 (GAUZE/BANDAGES/DRESSINGS) ×3 IMPLANT
DURAPREP 26ML APPLICATOR (WOUND CARE) ×3 IMPLANT
ELECT BLADE 4.0 EZ CLEAN MEGAD (MISCELLANEOUS)
ELECT CAUTERY BLADE 6.4 (BLADE) ×3 IMPLANT
ELECT REM PT RETURN 9FT ADLT (ELECTROSURGICAL) ×3
ELECTRODE BLDE 4.0 EZ CLN MEGD (MISCELLANEOUS) IMPLANT
ELECTRODE REM PT RTRN 9FT ADLT (ELECTROSURGICAL) ×1 IMPLANT
FACESHIELD WRAPAROUND (MASK) ×6 IMPLANT
FACESHIELD WRAPAROUND OR TEAM (MASK) ×2 IMPLANT
FILTER STRAW FLUID ASPIR (MISCELLANEOUS) ×2 IMPLANT
GLOVE BIO SURGEON STRL SZ8 (GLOVE) ×6 IMPLANT
GLOVE BIOGEL PI IND STRL 8 (GLOVE) ×2 IMPLANT
GLOVE BIOGEL PI INDICATOR 8 (GLOVE) ×4
GOWN STRL REUS W/ TWL LRG LVL3 (GOWN DISPOSABLE) ×1 IMPLANT
GOWN STRL REUS W/ TWL XL LVL3 (GOWN DISPOSABLE) ×2 IMPLANT
GOWN STRL REUS W/TWL LRG LVL3 (GOWN DISPOSABLE) ×3
GOWN STRL REUS W/TWL XL LVL3 (GOWN DISPOSABLE) ×6
KIT BASIN OR (CUSTOM PROCEDURE TRAY) ×3 IMPLANT
KIT ROOM TURNOVER OR (KITS) ×3 IMPLANT
MANIFOLD NEPTUNE II (INSTRUMENTS) ×3 IMPLANT
NEEDLE HYPO 22GX1.5 SAFETY (NEEDLE) ×3 IMPLANT
NS IRRIG 1000ML POUR BTL (IV SOLUTION) ×3 IMPLANT
PACK TOTAL JOINT (CUSTOM PROCEDURE TRAY) ×3 IMPLANT
PAD ARMBOARD 7.5X6 YLW CONV (MISCELLANEOUS) ×6 IMPLANT
RETRACTOR WND ALEXIS 18 MED (MISCELLANEOUS) ×1 IMPLANT
RTRCTR WOUND ALEXIS 18CM MED (MISCELLANEOUS) ×3
STAPLER VISISTAT 35W (STAPLE) ×3 IMPLANT
SUT ETHIBOND NAB CT1 #1 30IN (SUTURE) ×5 IMPLANT
SUT MNCRL AB 3-0 PS2 18 (SUTURE) ×2 IMPLANT
SUT VIC AB 0 CT1 27 (SUTURE)
SUT VIC AB 0 CT1 27XBRD ANBCTR (SUTURE) IMPLANT
SUT VIC AB 1 CT1 27 (SUTURE) ×3
SUT VIC AB 1 CT1 27XBRD ANBCTR (SUTURE) ×1 IMPLANT
SUT VIC AB 2-0 CT1 27 (SUTURE) ×3
SUT VIC AB 2-0 CT1 TAPERPNT 27 (SUTURE) ×1 IMPLANT
SUT VLOC 180 0 24IN GS25 (SUTURE) ×3 IMPLANT
SYR 50ML LL SCALE MARK (SYRINGE) ×3 IMPLANT
SYRINGE 1CC SLIP TB (MISCELLANEOUS) ×2 IMPLANT
TOWEL OR 17X24 6PK STRL BLUE (TOWEL DISPOSABLE) ×3 IMPLANT
TOWEL OR 17X26 10 PK STRL BLUE (TOWEL DISPOSABLE) ×6 IMPLANT
TRAY CATH 16FR W/PLASTIC CATH (SET/KITS/TRAYS/PACK) IMPLANT
TRAY FOLEY CATH 14FR (SET/KITS/TRAYS/PACK) IMPLANT
WATER STERILE IRR 1000ML POUR (IV SOLUTION) ×2 IMPLANT

## 2015-11-27 NOTE — Interval H&P Note (Signed)
History and Physical Interval Note:  11/27/2015 9:26 AM  Sundra AlandJeanette B Lafevers  has presented today for surgery, with the diagnosis of RIGHT HIP DEGENERATIVE JOINT DISEASE  The various methods of treatment have been discussed with the patient and family. After consideration of risks, benefits and other options for treatment, the patient has consented to  Procedure(s): TOTAL HIP ARTHROPLASTY ANTERIOR APPROACH (Right) as a surgical intervention .  The patient's history has been reviewed, patient examined, no change in status, stable for surgery.  I have reviewed the patient's chart and labs.  Questions were answered to the patient's satisfaction.     Laryn Venning G

## 2015-11-27 NOTE — Anesthesia Postprocedure Evaluation (Signed)
Anesthesia Post Note  Patient: Danielle Fields  Procedure(s) Performed: Procedure(s) (LRB): TOTAL HIP ARTHROPLASTY ANTERIOR APPROACH (Right)  Patient location during evaluation: PACU Anesthesia Type: Spinal and MAC Level of consciousness: awake, awake and alert, oriented and sedated Pain management: pain level controlled Vital Signs Assessment: post-procedure vital signs reviewed and stable Respiratory status: spontaneous breathing, nonlabored ventilation, respiratory function stable and patient connected to nasal cannula oxygen Cardiovascular status: blood pressure returned to baseline Postop Assessment: spinal receding Anesthetic complications: no    Last Vitals:  Vitals:   11/27/15 1535 11/27/15 1545  BP: (!) 121/58   Pulse: 69 66  Resp: 15 17  Temp:      Last Pain:  Vitals:   11/27/15 1531  TempSrc:   PainSc: 0-No pain                 Oveda Dadamo COKER

## 2015-11-27 NOTE — Op Note (Signed)
PRE-OP DIAGNOSIS:  RIGHT HIP DEGENERATIVE JOINT DISEASE POST-OP DIAGNOSIS:  same PROCEDURE: RIGHT TOTAL HIP ARTHROPLASTY ANTERIOR APPROACH ANESTHESIA:  Spinal and MAC SURGEON:  Marcene CorningPeter Taijon Vink MD ASSISTANT:  Elodia FlorenceAndrew Nida PA-C   INDICATIONS FOR PROCEDURE:  The patient is a 75 y.o. female with a long history of a painful hip.  This has persisted despite multiple conservative measures.  The patient has persisted with pain and dysfunction making rest and activity difficult.  A total hip replacement is offered as surgical treatment.  Informed operative consent was obtained after discussion of possible complications including reaction to anesthesia, infection, neurovascular injury, dislocation, DVT, PE, and death.  The importance of the postoperative rehab program to optimize result was stressed with the patient.  SUMMARY OF FINDINGS AND PROCEDURE:  Under general anesthesia through a anterior approach an the Hana table a right THR was performed.  The patient had severe degenerative change and good bone quality.  We used DePuy components to replace the hip and these were size KA 11 Corail femur capped with a -2 36mm metal hip ball.  On the acetabular side we used a size 54 Gription shell with a  plus 0 neutral polyethylene liner.  We did use a hole eliminator.  Elodia FlorenceAndrew Nida PA-C assisted throughout and was invaluable to the completion of the case in that he helped position and retract while I performed the procedure.  He also closed simultaneously to help minimize OR time.  I used fluoroscopy throughout the case to check position of implants and leg lengths and read all of these views myself.  DESCRIPTION OF PROCEDURE:  The patient was taken to the OR suite where general anesthetic was applied.  The patient was then positioned on the Hana table supine.  All bony prominences were appropriately padded.  Prep and drape was then performed in normal sterile fashion.  The patient was given kefzol preoperative antibiotic  and an appropriate time out was performed.  We then took an anterior approach to the right hip.  Dissection was taken through adipose to the tensor fascia lata fascia.  This structure was incised longitudinally and we dissected in the intermuscular interval just medial to this muscle.  Cobra retractors were placed superior and inferior to the femoral neck superficial to the capsule.  A capsular incision was then made and the retractors were placed along the femoral neck.  Xray was brought in to get a good level for the femoral neck cut which was made with an oscillating saw and osteotome.  The femoral head was removed with a corkscrew.  The acetabulum was exposed and some labral tissues were excised. Reaming was taken to the inside wall of the pelvis and sequentially up to 1 mm smaller than the actual component.  A trial of components was done and then the aforementioned acetabular shell was placed in appropriate tilt and anteversion confirmed by fluoroscopy. The liner was placed along with the hole eliminator and attention was turned to the femur.  The leg was brought down and over into adduction and the elevator bar was used to raise the femur up gently in the wound.  The piriformis was released with care taken to preserve the obturator internus attachment and all of the posterior capsule. The femur was reamed and then broached to the appropriate size.  A trial reduction was done and the aforementioned head and neck assembly gave us the best stability in extension with external rotation.  Leg lengths were felt to be about equal by  fluoroscopic exam.  The trial components were removed and the wound irrigated.  We then placed the femoral component in appropriate anteversion.  The head was applied to a dry stem neck and the hip again reduced.  It was again stable in the aforementioned position.  The would was irrigated again followed by re-approximation of anterior capsule with ethibond suture. Tensor fascia was  repaired with V-loc suture  followed by deep closure with #O and #2 undyed vicryl.  Skin was closed with subQ stitch and steristrips followed by a sterile dressing.  EBL and IOF can be obtained from anesthesia records.  DISPOSITION:  The patient was extubated in the OR and taken to PACU in stable condition to be admitted to the Orthopedic Surgery for appropriate post-op care to include perioperative antibiotics and DVT prophylaxis.

## 2015-11-27 NOTE — Anesthesia Preprocedure Evaluation (Addendum)
Anesthesia Evaluation  Patient identified by MRN, date of birth, ID band Patient awake    Reviewed: Allergy & Precautions, NPO status , Patient's Chart, lab work & pertinent test results  Airway Mallampati: II  TM Distance: >3 FB     Dental  (+) Teeth Intact, Dental Advisory Given   Pulmonary    breath sounds clear to auscultation       Cardiovascular  Rhythm:Regular Rate:Normal     Neuro/Psych    GI/Hepatic   Endo/Other  diabetes  Renal/GU      Musculoskeletal   Abdominal   Peds  Hematology   Anesthesia Other Findings   Reproductive/Obstetrics                            Anesthesia Physical Anesthesia Plan  ASA: III  Anesthesia Plan: MAC and Spinal   Post-op Pain Management:    Induction: Intravenous  Airway Management Planned: Natural Airway and Simple Face Mask  Additional Equipment:   Intra-op Plan:   Post-operative Plan:   Informed Consent: I have reviewed the patients History and Physical, chart, labs and discussed the procedure including the risks, benefits and alternatives for the proposed anesthesia with the patient or authorized representative who has indicated his/her understanding and acceptance.   Dental advisory given  Plan Discussed with: CRNA and Anesthesiologist  Anesthesia Plan Comments:         Anesthesia Quick Evaluation

## 2015-11-27 NOTE — Transfer of Care (Signed)
Immediate Anesthesia Transfer of Care Note  Patient: Danielle Fields  Procedure(s) Performed: Procedure(s): TOTAL HIP ARTHROPLASTY ANTERIOR APPROACH (Right)  Patient Location: PACU  Anesthesia Type:Spinal  Level of Consciousness: awake  Airway & Oxygen Therapy: Patient Spontanous Breathing and Patient connected to nasal cannula oxygen  Post-op Assessment: Report given to RN and Post -op Vital signs reviewed and stable  Post vital signs: Reviewed and stable  Last Vitals:  Vitals:   11/27/15 0755 11/27/15 1231  BP: (!) 165/73   Pulse: 95   Resp: 18   Temp: 37 C 36.6 C    Last Pain:  Vitals:   11/27/15 0755  TempSrc: Oral      Patients Stated Pain Goal: 6 (11/27/15 0758)  Complications: No apparent anesthesia complications

## 2015-11-27 NOTE — Anesthesia Procedure Notes (Signed)
Spinal  Start time: 11/27/2015 10:22 AM End time: 11/27/2015 10:27 AM Staffing Anesthesiologist: Noreene LarssonJOSLIN, Katrin Grabel Preanesthetic Checklist Completed: patient identified, site marked, surgical consent, pre-op evaluation, timeout performed, IV checked, risks and benefits discussed and monitors and equipment checked Spinal Block Patient position: sitting Prep: Betadine Patient monitoring: heart rate, cardiac monitor, continuous pulse ox and blood pressure Approach: right paramedian Location: L3-4 Injection technique: single-shot Needle Needle type: Tuohy  Needle gauge: 22 G Assessment Sensory level: T6 Additional Notes 12 mg 0.75% Bupivacaine injected easily

## 2015-11-28 ENCOUNTER — Encounter (HOSPITAL_COMMUNITY): Payer: Self-pay | Admitting: Orthopaedic Surgery

## 2015-11-28 MED ORDER — PROMETHAZINE HCL 25 MG PO TABS: 12.5000 mg | ORAL_TABLET | Freq: Four times a day (QID) | ORAL | Status: DC | PRN

## 2015-11-28 MED ORDER — TRAMADOL HCL 50 MG PO TABS
50.0000 mg | ORAL_TABLET | Freq: Four times a day (QID) | ORAL | Status: DC
  Administered 2015-11-28 (×3): 100 mg via ORAL
  Administered 2015-11-28: 50 mg via ORAL
  Administered 2015-11-29 (×2): 100 mg via ORAL
  Filled 2015-11-28 (×3): qty 2
  Filled 2015-11-28: qty 1
  Filled 2015-11-28 (×2): qty 2

## 2015-11-28 MED ORDER — TRAMADOL HCL 50 MG PO TABS: 50.0000 mg | ORAL_TABLET | Freq: Four times a day (QID) | ORAL | 0 refills | Status: AC

## 2015-11-28 MED ORDER — ASPIRIN 325 MG PO TBEC: 325.0000 mg | DELAYED_RELEASE_TABLET | Freq: Two times a day (BID) | ORAL | 0 refills | Status: AC

## 2015-11-28 MED ORDER — METHOCARBAMOL 500 MG PO TABS: 500.0000 mg | ORAL_TABLET | Freq: Four times a day (QID) | ORAL | 0 refills | Status: AC | PRN

## 2015-11-28 MED ORDER — PROMETHAZINE HCL 12.5 MG PO TABS: 12.5000 mg | ORAL_TABLET | Freq: Four times a day (QID) | ORAL | 0 refills | Status: AC | PRN

## 2015-11-28 NOTE — Evaluation (Signed)
Physical Therapy Evaluation Patient Details Name: Danielle Fields MRN: [id number] DOB: 1940-04-03 Today's Date: 11/28/2015   History of Present Illness  75 y.o. [facility] for elective R direct anterior THA.  Pt with significant PMHx of DM.   Clinical Impression  Pt is POD #1 and is moving well.  She was mildly lightheaded during gait and had to take a seated rest break, but was able to continue without difficulty.  Pt will likely progress well enough to d/c home with her husband's assist and HHPT f/u at discharge.   PT to follow acutely for deficits listed below.       Follow Up Recommendations Home health PT;Supervision for mobility/OOB    Equipment Recommendations  Rolling walker with 5" wheels    Recommendations for Other Services   NA    Precautions / Restrictions Precautions Precautions: Fall Restrictions RLE Weight Bearing: Weight bearing as tolerated      Mobility  Bed Mobility Overal bed mobility: Needs Assistance Bed Mobility: Supine to Sit     Supine to sit: Min assist     General bed mobility comments: Min assist to help progress right leg to left side of bed (pt gets out of left at home) from elevated bed simulating home height.   Transfers Overall transfer level: Needs assistance Equipment used: Rolling walker (2 wheeled) Transfers: Sit to/from Stand Sit to Stand: Min guard         General transfer comment: Min guard assist for safety during transitions.  Verbal cues for safe hand placement.   Ambulation/Gait Ambulation/Gait assistance: Min guard Ambulation Distance (Feet): 75 Feet (x 2 with seated rest break due to lightheadedness) Assistive device: Rolling walker (2 wheeled) Gait Pattern/deviations: Step-through pattern;Antalgic Gait velocity: decreased Gait velocity interpretation: Below normal speed for age/gender General Gait Details: moderately antalgic gait pattern, verbal cues for correct LE sequencing.           Balance Overall balance assessment: Needs assistance Sitting-balance support: Feet supported;No upper extremity supported Sitting balance-Leahy Scale: Good     Standing balance support: Bilateral upper extremity supported Standing balance-Leahy Scale: Poor                               Pertinent Vitals/Pain Pain Assessment: 0-10 Pain Score: 0-No pain (at rest, when moving, 4-5/10)    Home Living Family/patient expects to be discharged to:: Private residence Living Arrangements: Spouse/significant other Available Help at Discharge: Family;Available 24 hours/day Type of Home: House Home Access: Stairs to enter Entrance Stairs-Rails: None Entrance Stairs-Number of Steps: 3 Home Layout: One level        Prior Function Level of Independence: Independent         Comments: drives     Hand Dominance   Dominant Hand: Right    Extremity/Trunk Assessment   Upper Extremity Assessment: Defer to OT evaluation           Lower Extremity Assessment: RLE deficits/detail RLE Deficits / Details: right leg with normal post op pain and weakness. Ankle at least 3/5, knee, 3-/5, hip 2/5    Cervical / Trunk Assessment: Normal  Communication   Communication: No difficulties  Cognition Arousal/Alertness: Awake/alert Behavior During Therapy: WFL for tasks assessed/performed Overall Cognitive Status: Within Functional Limits for tasks assessed                         Exercises Total  Joint Exercises Ankle Circles/Pumps: AROM;Both;10 reps Quad Sets: AROM;Both;10 reps Heel Slides: AAROM;Right;10 reps   Assessment/Plan    PT Assessment Patient needs continued PT services  PT Problem List Decreased strength;Decreased range of motion;Decreased activity tolerance;Decreased balance;Decreased mobility;Decreased knowledge of use of DME;Pain          PT Treatment Interventions DME instruction;Gait training;Stair training;Functional mobility  training;Therapeutic activities;Therapeutic exercise;Balance training;Patient/family education;Manual techniques;Modalities    PT Goals (Current goals can be found in the Care Plan section)  Acute Rehab PT Goals Patient Stated Goal: to get back to her mile walking program PT Goal Formulation: With patient Time For Goal Achievement: 12/05/15 Potential to Achieve Goals: Good    Frequency 7X/week           End of Session Equipment Utilized During Treatment: Gait belt Activity Tolerance: Patient limited by pain;Patient limited by fatigue Patient left: in chair;with call bell/phone within reach Nurse Communication: Mobility status         Time: 9629-52841048-1121 PT Time Calculation (min) (ACUTE ONLY): 33 min   Charges:   PT Evaluation $PT Eval Low Complexity: 1 Procedure PT Treatments $Gait Training: 8-22 mins        Danielle Fields, PT, DPT [phone]   11/28/2015, 1:25 PM

## 2015-11-28 NOTE — Evaluation (Signed)
Occupational Therapy Evaluation and Discharge Patient Details Name: Danielle SaucierJeanette B Learn MRN: [id number] DOB: 11/19/1940 Today's Date: 11/28/2015    History of Present Illness 75 y.o. [facility] for elective R direct anterior THA.  Pt with significant PMHx of DM.    Clinical Impression   PTA Pt independent in ADL and mobility. Pt currently min guard for sink level ADL and min A for LB ADL. Pt with currently ADL ability level below. Pt and husband received all education including handout for using the 3 in 1 as a shower chair, and have no further questions or concerns about ADL going home. OT to sign off at this time. Thank you for this referral.     Follow Up Recommendations  No OT follow up;Supervision - Intermittent    Equipment Recommendations  3 in 1 bedside comode;Other (comment) (already in room)    Recommendations for Other Services       Precautions / Restrictions Precautions Precautions: Fall Precaution Comments: direct anterior approach - no precautions Restrictions Weight Bearing Restrictions: Yes RLE Weight Bearing: Weight bearing as tolerated      Mobility Bed Mobility Overal bed mobility: Needs Assistance Bed Mobility: Sit to Supine     Supine to sit: Min assist Sit to supine: Min assist   General bed mobility comments: Min assist to help lift surgical leg back into the bed.   Transfers Overall transfer level: Needs assistance Equipment used: Rolling walker (2 wheeled) Transfers: Sit to/from Stand Sit to Stand: Min guard         General transfer comment: Min guard assist for safety from lower recliner chair and toilet.     Balance Overall balance assessment: Needs assistance Sitting-balance support: Feet supported;No upper extremity supported Sitting balance-Leahy Scale: Good     Standing balance support: Bilateral upper extremity supported;No upper extremity supported;Single extremity supported Standing balance-Leahy  Scale: Fair Standing balance comment: sink level ADL                            ADL Overall ADL's : Needs assistance/impaired Eating/Feeding: Modified independent;Sitting   Grooming: Wash/dry hands;Brushing hair;Min guard;Standing Grooming Details (indicate cue type and reason): sink level     Lower Body Bathing: Minimal assistance;With caregiver independent assisting;Sitting/lateral leans Lower Body Bathing Details (indicate cue type and reason): educated in long handle sponge Upper Body Dressing : Set up;Sitting   Lower Body Dressing: Minimal assistance;Sit to/from stand;With caregiver independent assisting   Toilet Transfer: Min guard;Cueing for sequencing;Ambulation;Comfort height toilet;Grab bars;RW StatisticianToilet Transfer Details (indicate cue type and reason): in bathroom Toileting- Clothing Manipulation and Hygiene: Supervision/safety;Sit to/from stand;Cueing for safety Toileting - Clothing Manipulation Details (indicate cue type and reason): vc for safe hand placement Tub/ Shower Transfer: Tub transfer;Min guard;Minimal assistance;With caregiver independent assisting;Anterior/posterior;3 in Scientist, water quality1;Rolling walker Tub/Shower Transfer Details (indicate cue type and reason): provided handout and demonstration with husband present Functional mobility during ADLs: Min guard;Rolling walker       Vision Vision Assessment?: No apparent visual deficits (wearing glasses for session)   Perception     Praxis      Pertinent Vitals/Pain Pain Assessment: 0-10 Pain Score: 4  Pain Location: right hip Pain Descriptors / Indicators: Sore (stiff) Pain Intervention(s): Limited activity within patient's tolerance;Monitored during session;Repositioned;Ice applied     Hand Dominance Right   Extremity/Trunk Assessment Upper Extremity Assessment Upper Extremity Assessment: Overall WFL for tasks assessed   Lower Extremity Assessment Lower Extremity Assessment:  RLE deficits/detail;Defer  to PT evaluation RLE Deficits / Details: decreased strength and ROM post-op   Cervical / Trunk Assessment Cervical / Trunk Assessment: Normal   Communication Communication Communication: No difficulties   Cognition Arousal/Alertness: Awake/alert Behavior During Therapy: WFL for tasks assessed/performed Overall Cognitive Status: Within Functional Limits for tasks assessed                     General Comments       Exercises      Shoulder Instructions      Home Living Family/patient expects to be discharged to:: Private residence Living Arrangements: Spouse/significant other Available Help at Discharge: Family;Available 24 hours/day Type of Home: House Home Access: Stairs to enter Entergy CorporationEntrance Stairs-Number of Steps: 3 Entrance Stairs-Rails: None Home Layout: One level     Bathroom Shower/Tub: Tub/shower unit Shower/tub characteristics: Engineer, building servicesCurtain Bathroom Toilet: Standard Bathroom Accessibility: Yes How Accessible: Accessible via walker     Additional Comments: Pt had walker and 3 in 1 in hospital room to take home      Prior Functioning/Environment Level of Independence: Independent        Comments: drives        OT Problem List: Decreased strength;Decreased range of motion;Decreased activity tolerance;Impaired balance (sitting and/or standing);Decreased knowledge of use of DME or AE;Pain   OT Treatment/Interventions:      OT Goals(Current goals can be found in the care plan section) Acute Rehab OT Goals Patient Stated Goal: to get back to her mile walking program OT Goal Formulation: With patient Time For Goal Achievement: 12/05/15 Potential to Achieve Goals: Good  OT Frequency:     Barriers to D/C:            Co-evaluation              End of Session Equipment Utilized During Treatment: Rolling walker Nurse Communication: Mobility status  Activity Tolerance: Patient tolerated treatment well Patient left: in chair;with call bell/phone  within reach;with family/visitor present   Time: 4540-98111123-1151 OT Time Calculation (min): 28 min Charges:  OT General Charges $OT Visit: 1 Procedure OT Evaluation $OT Eval Moderate Complexity: 1 Procedure OT Treatments $Self Care/Home Management : 8-22 mins G-Codes:    Evern BioLaura J Tonda Wiederhold 11/28/2015, 2:17 PM Sherryl MangesLaura Malesha Suliman OTR/L [phone]

## 2015-11-28 NOTE — Discharge Instructions (Signed)

## 2015-11-28 NOTE — Care Management Note (Signed)
Case Management Note  Patient Details  Name: Danielle SaucierJeanette B Fields MRN: [id number] Date of Birth: October 21, 1940  Subjective/Objective:   Right THA                 Action/Plan: Discharge Planning: N[facility] spoke to pt and [facility]. [facility] Requesting RW and 3n1 for home. [employer] with deliver RW and 3n1 to room prior to dc.     Expected Discharge Date:  11/29/2015               Expected Discharge Plan:  Home w Home Health Services  In-House Referral:  NA  Discharge planning Services  CM Consult  Post Acute Care Choice:  Home Health Choice offered to:  Patient  DME Arranged:  3-N-1, Walker rolling DME Agency:  [facility]  HH [facility]  [facility]  [facility]  Status of Service:  Completed, signed off  If discussed at Long Length of Stay Meetings, dates discussed:    Additional Comments:  Elliot CousinShavis, Aaliyha Mumford Ellen, RN 11/28/2015, 10:23 AM

## 2015-11-28 NOTE — Progress Notes (Signed)
Subjective: 1 Day Post-Op Procedure(s) (LRB): TOTAL HIP ARTHROPLASTY ANTERIOR APPROACH (Right)   Patient sitting up and eating well. Only complaint of nausea after pain medicine last night.  Activity level:  wbaT Diet tolerance:  OK Voiding:  OK Patient reports pain as mild.    Objective: Vital signs in last 24 hours: Temp:  [97.9 F (36.6 C)-98.6 F (37 C)] 98.1 F (36.7 C) (11/22 0429) Pulse Rate:  [54-95] 70 (11/22 0429) Resp:  [8-22] 16 (11/22 0429) BP: (76-165)/(39-73) 118/51 (11/22 0429) SpO2:  [98 %-100 %] 99 % (11/22 0429) Weight:  [61.7 kg (136 lb)] 61.7 kg (136 lb) (11/21 0755)  Labs: No results for input(s): HGB in the last 72 hours. No results for input(s): WBC, RBC, HCT, PLT in the last 72 hours. No results for input(s): NA, K, CL, CO2, BUN, CREATININE, GLUCOSE, CALCIUM in the last 72 hours. No results for input(s): LABPT, INR in the last 72 hours.  Physical Exam:  Neurologically intact ABD soft Neurovascular intact Sensation intact distally Intact pulses distally Dorsiflexion/Plantar flexion intact Incision: dressing C/D/I and no drainage No cellulitis present Compartment soft  Assessment/Plan:  1 Day Post-Op Procedure(s) (LRB): TOTAL HIP ARTHROPLASTY ANTERIOR APPROACH (Right) Advance diet Up with therapy Plan for discharge tomorrow Discharge home with home health if doing well and cleared by PT. I will switch pain meds from norco to tramadol to help with nausea along with adding phenergan prn. Continue on ASA 325mg  BID x 4 weeks post op. Follow up in office 2 weeks post op.  Danielle Fields, Ginger OrganNDREW PAUL 11/28/2015, 7:28 AM

## 2015-11-28 NOTE — Progress Notes (Signed)
Physical Therapy Treatment Patient Details Name: Danielle SaucierJeanette B Fields MRN: [id number] DOB: 08-27-1940 Today's Date: 11/28/2015    History of Present Illness 75 [facility].o. [facility] for elective R direct anterior THA.  Pt with significant PMHx of DM.     PT Comments    Pt is POD #1 and this is her second session.  She is progressing well with her mobility and was able to walk without sitting this afternoon.  She will need stair training in AM before d/c home as well as reviewing her standing and seated leg exercises.   Follow Up Recommendations  Home health PT;Supervision for mobility/OOB     Equipment Recommendations  Rolling walker with 5" wheels;3in1 (PT) (already delivered to room and husband took home)    Recommendations for Other Services   NA     Precautions / Restrictions Precautions Precautions: Fall Precaution Comments: direct anterior approach - no precautions Restrictions Weight Bearing Restrictions: Yes RLE Weight Bearing: Weight bearing as tolerated    Mobility  Bed Mobility Overal bed mobility: Needs Assistance Bed Mobility: Sit to Supine     Supine to sit: Min assist Sit to supine: Min assist   General bed mobility comments: Min assist to help lift surgical leg back into the bed.   Transfers Overall transfer level: Needs assistance Equipment used: Rolling walker (2 wheeled) Transfers: Sit to/from Stand Sit to Stand: Min guard         General transfer comment: Min guard assist for safety from lower recliner chair and toilet.   Ambulation/Gait Ambulation/Gait assistance: Min guard Ambulation Distance (Feet): 130 Feet Assistive device: Rolling walker (2 wheeled) Gait Pattern/deviations: Step-through pattern;Antalgic Gait velocity: decreased Gait velocity interpretation: Below normal speed for age/gender General Gait Details: pt with moderately antalgic gait pattern, which improved with increased distance as she worked the  stiffness out.  Pt did not need a seated rest break during gait this PM and did not report any lightheadedness.            Balance Overall balance assessment: Needs assistance Sitting-balance support: Feet supported;No upper extremity supported Sitting balance-Leahy Scale: Good     Standing balance support: Bilateral upper extremity supported;No upper extremity supported;Single extremity supported Standing balance-Leahy Scale: Fair                      Cognition Arousal/Alertness: Awake/alert Behavior During Therapy: WFL for tasks assessed/performed Overall Cognitive Status: Within Functional Limits for tasks assessed                      Exercises Total Joint Exercises Ankle Circles/Pumps: AROM;Both;10 reps Quad Sets: AROM;Both;10 reps Short Arc Quad: AROM;Right;10 reps Heel Slides: AAROM;Right;10 reps Hip ABduction/ADduction: AROM;Right;10 reps        Pertinent Vitals/Pain Pain Assessment: 0-10 Pain Score: 4  Pain Location: right hip Pain Descriptors / Indicators: Sore (stiff) Pain Intervention(s): Limited activity within patient's tolerance;Monitored during session;Repositioned;Ice applied    Home Living Family/patient expects to be discharged to:: Private residence Living Arrangements: Spouse/significant other Available Help at Discharge: Family;Available 24 hours/day Type of Home: House Home Access: Stairs to enter Entrance Stairs-Rails: None Home Layout: One level   Additional Comments: Pt had walker and 3 in 1 in hospital room to take home    Prior Function Level of Independence: Independent      Comments: drives   PT Goals (current goals can now be found in the care plan section) Acute Rehab PT  Goals Patient Stated Goal: to get back to her mile walking program PT Goal Formulation: With patient Time For Goal Achievement: 12/05/15 Potential to Achieve Goals: Good Progress towards PT goals: Progressing toward goals     Frequency    7X/week      PT Plan Current plan remains appropriate       End of Session Equipment Utilized During Treatment: Gait belt Activity Tolerance: Patient limited by pain Patient left: in bed;with call bell/phone within reach     Time: 1344-1403 PT Time Calculation (min) (ACUTE ONLY): 19 min  Charges:  $Gait Training: 8-22 mins                      Demisha Nokes B. Yon Schiffman, PT, DPT [phone]   11/28/2015, 2:13 PM

## 2015-11-29 NOTE — Progress Notes (Signed)
Pt discharged to home with belongings, IVs and tele removed. Patient and husband given instructions and prescriptions, teach back performed. Patient stable at time of discharge.

## 2015-11-29 NOTE — Discharge Summary (Signed)
Patient ID: Danielle Fields MRN: [id number] DOB/AGE: 1940-12-08 75 y.o.  Admit date: 11/27/2015 Discharge date: 11/29/2015  Admission Diagnoses:  Principal Problem:   Primary osteoarthritis of right hip   Discharge Diagnoses:  Same  Past Medical History:  Diagnosis Date  . Arthritis   . Diabetes mellitus without complication (HCC)   . Hypothyroidism     Surgeries: Procedure(s): TOTAL HIP ARTHROPLASTY ANTERIOR APPROACH on 11/27/2015   Consultants:   Discharged Condition: Improved  Hospital Course: Danielle Fields is an 75 y.o. female who was admitted 11/27/2015 for operative treatment ofPrimary osteoarthritis of right hip. Patient has severe unremitting pain that affects sleep, daily activities, and work/hobbies. After pre-op clearance the patient was taken to the operating room on 11/27/2015 and underwent  Procedure(s): TOTAL HIP ARTHROPLASTY ANTERIOR APPROACH.    Patient was given perioperative antibiotics: Anti-infectives    Start     Dose/Rate Route Frequency Ordered Stop   11/27/15 1630  ceFAZolin (ANCEF) IVPB 2g/100 mL premix     2 g 200 mL/hr over 30 Minutes Intravenous Every 6 hours 11/27/15 1616 11/27/15 2151   11/27/15 0747  ceFAZolin (ANCEF) 2-4 GM/100ML-% IVPB    Comments:  Lorenda IshiharaGibbs, Bonnie   : cabinet override      11/27/15 0747 11/27/15 1020   11/27/15 0745  ceFAZolin (ANCEF) IVPB 2g/100 mL premix     2 g 200 mL/hr over 30 Minutes Intravenous On call to O.R. 11/27/15 0745 11/27/15 1020       Patient was given sequential compression devices, early ambulation, and ASA to prevent DVT.  Patient benefited maximally from hospital stay and there were no complications.    Recent vital signs: Patient Vitals for the past 24 hrs:  BP Temp Temp src Pulse Resp SpO2  11/29/15 0457 (!) 111/43 98.4 F (36.9 C) Oral 75 - 96 %  11/28/15 2054 (!) 113/47 98.2 F (36.8 C) Oral 75 16 98 %  11/28/15 1300 (!) 101/49 97.7 F (36.5 C) Oral 69 16 99 %     Recent  laboratory studies: No results for input(s): WBC, HGB, HCT, PLT, NA, K, CL, CO2, BUN, CREATININE, GLUCOSE, INR, CALCIUM in the last 72 hours.  Invalid input(s): PT, 2   Discharge Medications:     Medication List    TAKE these medications   aspirin 325 MG EC tablet Take 1 tablet (325 mg total) by mouth 2 (two) times daily after a meal. What changed:  medication strength  how much to take  when to take this   CALCIUM PO Take 1 capsule by mouth 2 (two) times daily.   cholecalciferol 1000 units tablet Commonly known as:  VITAMIN D Take 1,000 Units by mouth daily.   co-enzyme Q-10 30 MG capsule Take 30 mg by mouth daily.   FISH OIL PO Take 1 capsule by mouth daily.   levothyroxine 100 MCG tablet Commonly known as:  SYNTHROID, LEVOTHROID Take 1 tablet by mouth daily.   LIVALO 4 MG Tabs Generic drug:  Pitavastatin Calcium Take 2 mg by mouth daily.   methocarbamol 500 MG tablet Commonly known as:  ROBAXIN Take 1 tablet (500 mg total) by mouth every 6 (six) hours as needed for muscle spasms.   promethazine 12.5 MG tablet Commonly known as:  PHENERGAN Take 1-2 tablets (12.5-25 mg total) by mouth every 6 (six) hours as needed for nausea or vomiting.   traMADol 50 MG tablet Commonly known as:  ULTRAM Take 1-2 tablets (50-100 mg total) by mouth every  6 (six) hours.            Durable Medical Equipment        Start     Ordered   11/27/15 1617  DME Walker rolling  Once    Question:  Patient needs a walker to treat with the following condition  Answer:  Primary osteoarthritis of right hip   11/27/15 1616   11/27/15 1617  DME 3 n 1  Once     11/27/15 1616   11/27/15 1617  DME Bedside commode  Once    Question:  Patient needs a bedside commode to treat with the following condition  Answer:  Primary osteoarthritis of right hip   11/27/15 1616      Diagnostic Studies: Dg Chest 2 View  Result Date: 11/15/2015 CLINICAL DATA:  Preop for hip replacement EXAM: CHEST   2 VIEW COMPARISON:  None. FINDINGS: Cardiomediastinal silhouette is unremarkable. Dextroscoliosis of thoracic spine. Mild degenerative changes thoracic spine. No infiltrate or pulmonary edema. IMPRESSION: No active cardiopulmonary disease.  Thoracic dextroscoliosis. Electronically Signed   By: Natasha MeadLiviu  Pop M.D.   On: 11/15/2015 11:07   Dg C-arm 61-120 Min  Result Date: 11/27/2015 CLINICAL DATA:  Right hip replacement EXAM: DG C-ARM 61-120 MIN; OPERATIVE RIGHT HIP WITH PELVIS COMPARISON:  None. FLUOROSCOPY TIME:  Radiation Exposure Index (as provided by the fluoroscopic device): Not available If the device does not provide the exposure index: Fluoroscopy Time:  29 seconds Number of Acquired Images:  2 FINDINGS: Right hip replacement is noted in satisfactory position. No acute bony or soft tissue abnormality is noted. IMPRESSION: Right hip replacement without acute abnormality. Electronically Signed   By: Alcide CleverMark  Lukens M.D.   On: 11/27/2015 12:48   Dg Hip Operative Unilat W Or W/o Pelvis Right  Result Date: 11/27/2015 CLINICAL DATA:  Right hip replacement EXAM: DG C-ARM 61-120 MIN; OPERATIVE RIGHT HIP WITH PELVIS COMPARISON:  None. FLUOROSCOPY TIME:  Radiation Exposure Index (as provided by the fluoroscopic device): Not available If the device does not provide the exposure index: Fluoroscopy Time:  29 seconds Number of Acquired Images:  2 FINDINGS: Right hip replacement is noted in satisfactory position. No acute bony or soft tissue abnormality is noted. IMPRESSION: Right hip replacement without acute abnormality. Electronically Signed   By: Alcide CleverMark  Lukens M.D.   On: 11/27/2015 12:48    Disposition: Final discharge disposition not confirmed  Discharge Instructions    Call MD / Call 911    Complete by:  As directed    If you experience chest pain or shortness of breath, CALL 911 and be transported to the hospital emergency room.  If you develope a fever above 101 F, pus (white drainage) or increased  drainage or redness at the wound, or calf pain, call your surgeon's office.   Constipation Prevention    Complete by:  As directed    Drink plenty of fluids.  Prune juice may be helpful.  You may use a stool softener, such as Colace (over the counter) 100 mg twice a day.  Use MiraLax (over the counter) for constipation as needed.   Diet - low sodium heart healthy    Complete by:  As directed    Increase activity slowly as tolerated    Complete by:  As directed       Follow-up Information    DALLDORF,PETER G, MD. Schedule an appointment as soon as possible for a visit in 2 week(s).   Specialty:  Orthopedic Surgery Contact  information: [address] [place] [address] [phone]        [facility] Follow up.   Why:  Home Health Physical Therapy Contact information: [address] [place] [address] [phone]            Signed: Jiles Harold 11/29/2015, 9:36 AM

## 2015-11-29 NOTE — Progress Notes (Signed)
Physical Therapy Treatment Patient Details Name: Danielle SaucierJeanette B Fields MRN: [id number] DOB: [birth year] Today's Date: 11/29/2015    History of Present Illness 75 y.o. female [facility] for elective R direct anterior THA.  Pt with significant PMHx of DM.     PT Comments    Patient is making good progress with PT.  From a mobility standpoint anticipate patient will be ready for DC home when medically ready.      Follow Up Recommendations  Home health PT;Supervision for mobility/OOB     Equipment Recommendations  Rolling walker with 5" wheels;3in1 (PT) (already delivered to room and husband took home)    Recommendations for Other Services       Precautions / Restrictions Precautions Precautions: Fall Precaution Comments: direct anterior approach - no precautions Restrictions Weight Bearing Restrictions: Yes RLE Weight Bearing: Weight bearing as tolerated    Mobility  Bed Mobility               General bed mobility comments: pt OOB in chair upon arrival  Transfers Overall transfer level: Needs assistance Equipment used: Rolling walker (2 wheeled) Transfers: Sit to/from Stand Sit to Stand: Supervision         General transfer comment: supervision for safety; safe hand placement and technique  Ambulation/Gait Ambulation/Gait assistance: Supervision Ambulation Distance (Feet): 200 Feet Assistive device: Rolling walker (2 wheeled) Gait Pattern/deviations: Step-through pattern;Decreased stance time - right;Decreased step length - left;Decreased weight shift to right Gait velocity: decreased   General Gait Details: pt with improved step through pattern with increased distance; cues for R heel strike   Stairs Stairs: Yes Stairs assistance: Min assist Stair Management: No rails;Backwards;With walker Number of Stairs: 2 General stair comments: cues for sequencing and technique; assist to stabilize RW; pt given handout on technique  Wheelchair  Mobility    Modified Rankin (Stroke Patients Only)       Balance     Sitting balance-Leahy Scale: Good       Standing balance-Leahy Scale: Fair                      Cognition Arousal/Alertness: Awake/alert Behavior During Therapy: WFL for tasks assessed/performed Overall Cognitive Status: Within Functional Limits for tasks assessed                      Exercises Total Joint Exercises Hip ABduction/ADduction: Standing Knee Flexion: AROM;Right;10 reps;Standing Marching in Standing: AROM;Right;10 reps;Standing    General Comments        Pertinent Vitals/Pain Pain Assessment: 0-10 Pain Score: 4  Pain Location: R hip Pain Descriptors / Indicators: Sore;Tightness Pain Intervention(s): Limited activity within patient's tolerance;Monitored during session;Premedicated before session;Repositioned    Home Living                      Prior Function            PT Goals (current goals can now be found in the care plan section) Acute Rehab PT Goals Patient Stated Goal: to get back to her mile walking program Progress towards PT goals: Progressing toward goals    Frequency    7X/week      PT Plan Current plan remains appropriate    Co-evaluation             End of Session Equipment Utilized During Treatment: Gait belt Activity Tolerance: Patient tolerated treatment well Patient left: with call bell/phone within reach;in chair  Time: [id number] PT Time Calculation (min) (ACUTE ONLY): 20 min  Charges:  $Gait Training: 8-22 mins                    G Codes:      Derek MoundKellyn R Cheo Selvey Hatim Homann, PTA Pager: [phone]   11/29/2015, 11:11 AM

## 2015-11-29 NOTE — Progress Notes (Signed)
   PATIENT ID: Danielle Fields   2 Days Post-Op Procedure(s) (LRB): TOTAL HIP ARTHROPLASTY ANTERIOR APPROACH (Right)  Subjective: Repots doing very well, comfortable. Excited to go home today with her husband.   Objective:  Vitals:   11/28/15 2054 11/29/15 0457  BP: (!) 113/47 (!) 111/43  Pulse: 75 75  Resp: 16   Temp: 98.2 F (36.8 C) 98.4 F (36.9 C)    Neurologically intact ABD soft Neurovascular intact Sensation intact distally Intact pulses distally Dorsiflexion/Plantar flexion intact Incision: dressing C/D/I and no drainage No cellulitis present Compartment soft   Labs:  No results for input(s): HGB in the last 72 hours.No results for input(s): WBC, RBC, HCT, PLT in the last 72 hours.No results for input(s): NA, K, CL, CO2, BUN, CREATININE, GLUCOSE, CALCIUM in the last 72 hours.  Assessment and Plan: 2 Day Post-Op Procedure(s) (LRB): TOTAL HIP ARTHROPLASTY ANTERIOR APPROACH (Right) Advance diet Up with therapy Plan for discharge today Discharge home today with home health if doing well and cleared by PT. Continue on ASA 325mg  BID x 4 weeks post op. Follow up in office 2 weeks post op.

## 2015-12-04 ENCOUNTER — Telehealth: Payer: Self-pay

## 2015-12-04 NOTE — Telephone Encounter (Signed)
Patient aware of echo results. Per Dr. Eden EmmsNishan, echo is normal. Patient verbalized understanding.

## 2015-12-04 NOTE — Telephone Encounter (Signed)
-----   Message from Wendall StadePeter C Nishan, MD sent at 12/04/2015 10:11 AM EST -----  Echo is normal  ----- Message ----- From: Elliot CousinJennifer Witty, RMA Sent: 12/04/2015  10:10 AM To: Wendall StadePeter C Nishan, MD  Was just helping Pam out with results and came across this pt that hasn't been resulted yet and I just wanted to make sure you received it.  Thanks!

## 2015-12-24 ENCOUNTER — Ambulatory Visit: Payer: Medicare Other

## 2016-01-04 ENCOUNTER — Ambulatory Visit
Admission: RE | Admit: 2016-01-04 | Discharge: 2016-01-04 | Disposition: A | Discharge status: Home / Self Care | Payer: Medicare Other | Source: Ambulatory Visit | Attending: Family Medicine | Admitting: Family Medicine

## 2016-01-04 DIAGNOSIS — Z1231 Encounter for screening mammogram for malignant neoplasm of breast: Secondary | ICD-10-CM

## 2017-01-09 ENCOUNTER — Other Ambulatory Visit: Payer: Self-pay | Admitting: Family Medicine

## 2017-01-09 DIAGNOSIS — Z1231 Encounter for screening mammogram for malignant neoplasm of breast: Secondary | ICD-10-CM

## 2017-01-28 ENCOUNTER — Ambulatory Visit
Admission: RE | Admit: 2017-01-28 | Discharge: 2017-01-28 | Disposition: A | Discharge status: Home / Self Care | Payer: Medicare Other | Source: Ambulatory Visit | Attending: Family Medicine | Admitting: Family Medicine

## 2017-01-28 DIAGNOSIS — Z1231 Encounter for screening mammogram for malignant neoplasm of breast: Secondary | ICD-10-CM

## 2018-01-11 ENCOUNTER — Other Ambulatory Visit: Payer: Self-pay | Admitting: Family Medicine

## 2018-01-11 DIAGNOSIS — Z1231 Encounter for screening mammogram for malignant neoplasm of breast: Secondary | ICD-10-CM

## 2018-02-09 ENCOUNTER — Ambulatory Visit
Admission: RE | Admit: 2018-02-09 | Discharge: 2018-02-09 | Disposition: A | Discharge status: Home / Self Care | Payer: Medicare Other | Source: Ambulatory Visit | Attending: Family Medicine | Admitting: Family Medicine

## 2018-02-09 DIAGNOSIS — Z1231 Encounter for screening mammogram for malignant neoplasm of breast: Secondary | ICD-10-CM

## 2019-01-31 ENCOUNTER — Ambulatory Visit: Payer: Medicare Other | Attending: Internal Medicine

## 2019-01-31 DIAGNOSIS — Z23 Encounter for immunization: Secondary | ICD-10-CM | POA: Insufficient documentation

## 2019-01-31 NOTE — Progress Notes (Signed)
   Covid-19 Vaccination Clinic  Name:  Danielle Fields    MRN: [id number] DOB: 08/21/1940  01/31/2019  Danielle Fields was observed post Covid-19 immunization for 15 minutes without incidence. She was provided with Vaccine Information Sheet and instruction to access the V-Safe system.   Danielle Fields was instructed to call 911 with any severe reactions post vaccine: Marland Kitchen Difficulty breathing  . Swelling of your face and throat  . A fast heartbeat  . A bad rash all over your body  . Dizziness and weakness    Immunizations Administered    Name Date Dose VIS Date Route   Pfizer COVID-19 Vaccine 01/31/2019  9:34 AM 0.3 mL 12/17/2018 Intramuscular   Manufacturer: [employer], [employer]   Lot: [id number]   NDC: [address]-3

## 2019-02-08 ENCOUNTER — Other Ambulatory Visit: Payer: Self-pay | Admitting: Family Medicine

## 2019-02-08 DIAGNOSIS — Z1231 Encounter for screening mammogram for malignant neoplasm of breast: Secondary | ICD-10-CM

## 2019-02-18 ENCOUNTER — Ambulatory Visit: Payer: Medicare Other | Attending: Internal Medicine

## 2019-02-18 DIAGNOSIS — Z23 Encounter for immunization: Secondary | ICD-10-CM | POA: Insufficient documentation

## 2019-02-18 NOTE — Progress Notes (Signed)
   Covid-19 Vaccination Clinic  Name:  Danielle Fields    MRN: [id number] DOB: 07-Apr-1940  02/18/2019  [name] was observed post Covid-19 immunization for 15 minutes without incidence. She was provided with Vaccine Information Sheet and instruction to access the V-Safe system.   [name] was instructed to call 911 with any severe reactions post vaccine: Marland Kitchen Difficulty breathing  . Swelling of your face and throat  . A fast heartbeat  . A bad rash all over your body  . Dizziness and weakness    Immunizations Administered    Name Date Dose VIS Date Route   Pfizer COVID-19 Vaccine 02/18/2019  8:20 AM 0.3 mL 12/17/2018 Intramuscular   Manufacturer: [employer], [employer]   Lot: [id number]   NDC: [address]-5

## 2019-03-15 ENCOUNTER — Ambulatory Visit: Payer: Medicare Other

## 2019-05-20 ENCOUNTER — Ambulatory Visit: Payer: Medicare Other

## 2019-06-10 ENCOUNTER — Ambulatory Visit
Admission: RE | Admit: 2019-06-10 | Discharge: 2019-06-10 | Disposition: A | Discharge status: Home / Self Care | Payer: Medicare Other | Source: Ambulatory Visit | Attending: Family Medicine | Admitting: Family Medicine

## 2019-06-10 ENCOUNTER — Other Ambulatory Visit: Payer: Self-pay

## 2019-06-10 DIAGNOSIS — Z1231 Encounter for screening mammogram for malignant neoplasm of breast: Secondary | ICD-10-CM

## 2019-10-31 ENCOUNTER — Ambulatory Visit: Payer: Medicare Other

## 2020-05-30 ENCOUNTER — Other Ambulatory Visit: Payer: Self-pay | Admitting: Family Medicine

## 2020-05-30 DIAGNOSIS — Z1231 Encounter for screening mammogram for malignant neoplasm of breast: Secondary | ICD-10-CM

## 2020-07-27 ENCOUNTER — Other Ambulatory Visit: Payer: Self-pay

## 2020-07-27 ENCOUNTER — Ambulatory Visit
Admission: RE | Admit: 2020-07-27 | Discharge: 2020-07-27 | Disposition: A | Discharge status: Home / Self Care | Payer: Medicare Other | Source: Ambulatory Visit | Attending: Family Medicine | Admitting: Family Medicine

## 2020-07-27 DIAGNOSIS — Z1231 Encounter for screening mammogram for malignant neoplasm of breast: Secondary | ICD-10-CM

## 2021-01-04 IMAGING — MG DIGITAL SCREENING BILAT W/ TOMO W/ CAD
8 series · 8 of 24 positions shown · non-contrast
Comparison: Previous exam(s).

CLINICAL DATA: Screening.

EXAM:
DIGITAL SCREENING BILATERAL MAMMOGRAM WITH TOMO AND CAD

[R CC synth-2D]
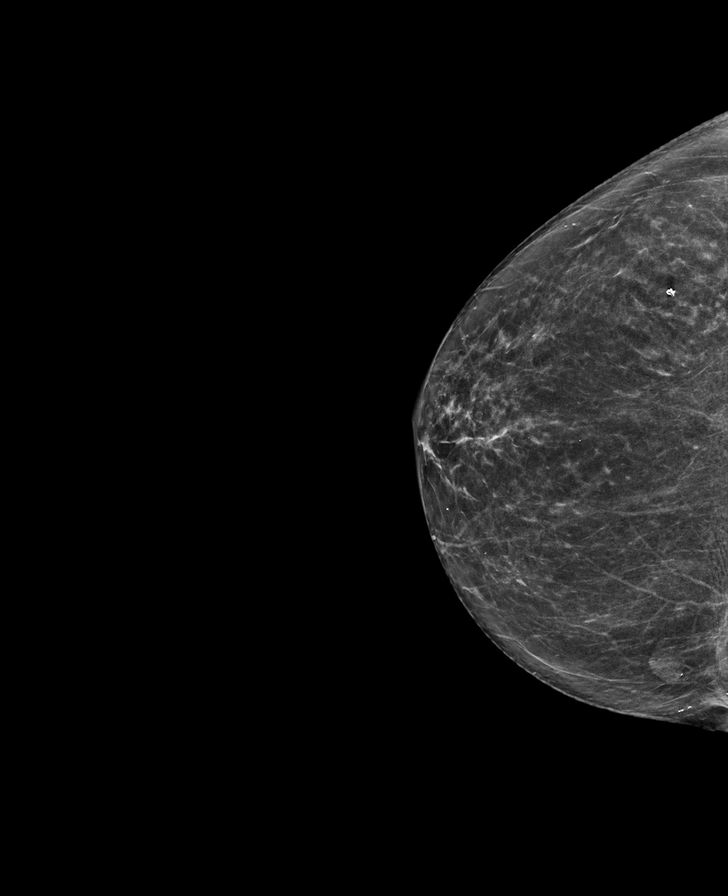

[L MLO synth-2D]
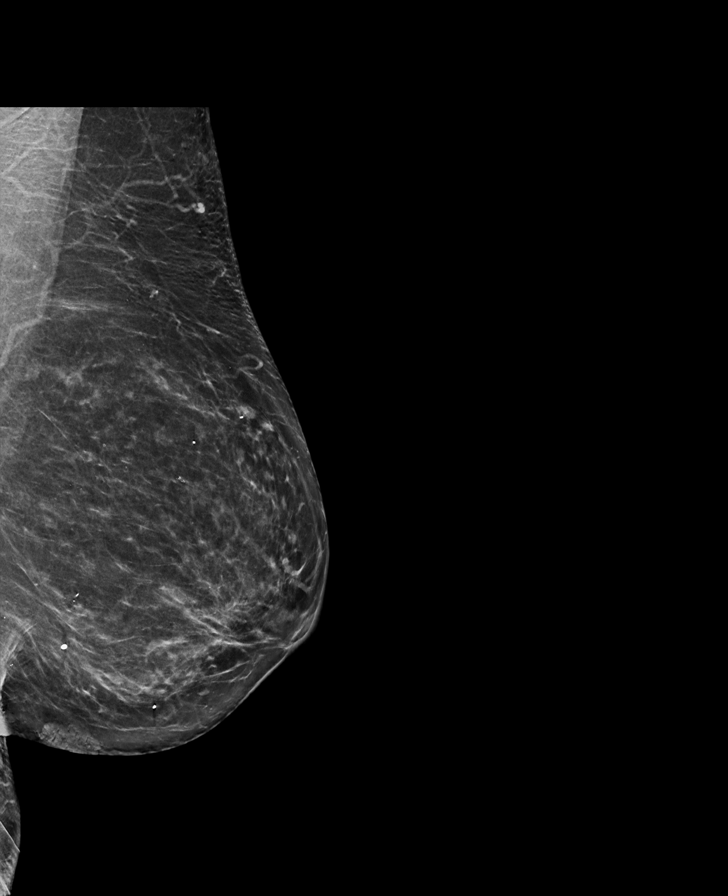

[L CC synth-2D]
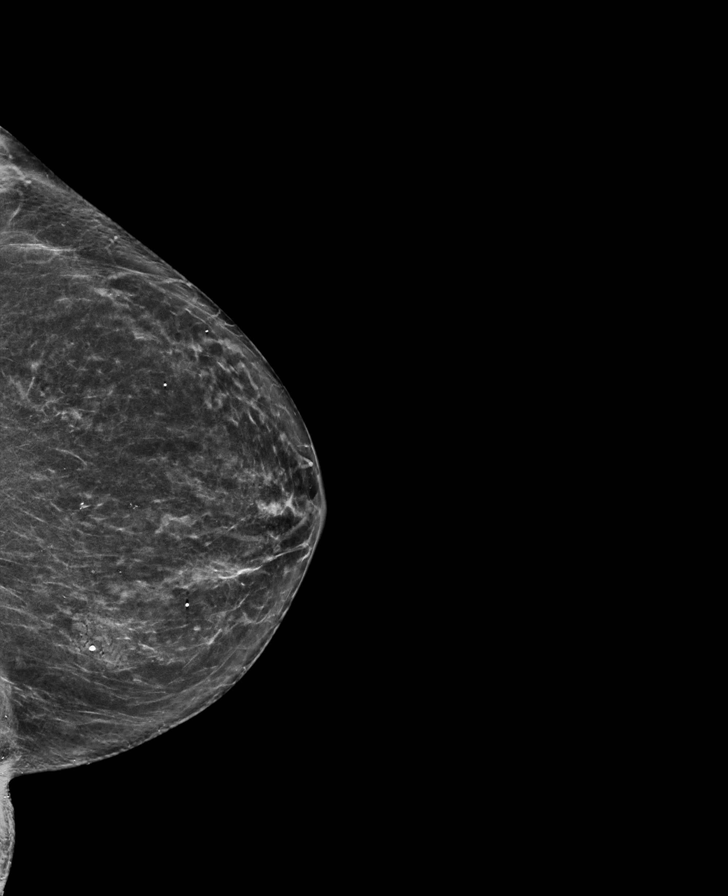

[R MLO synth-2D]
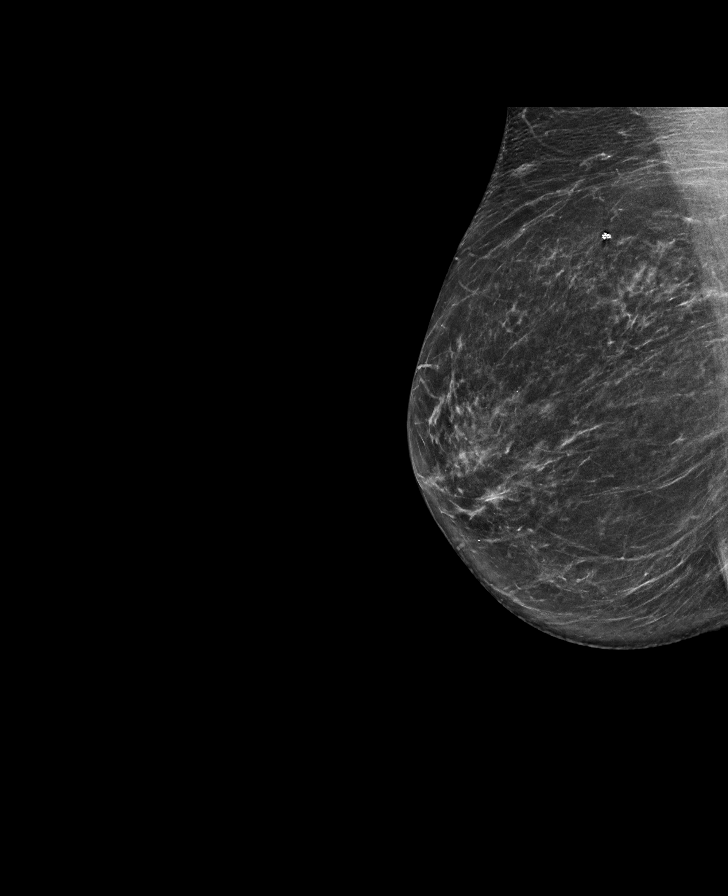

[L CC tomo · tomo slice 33/66.0]
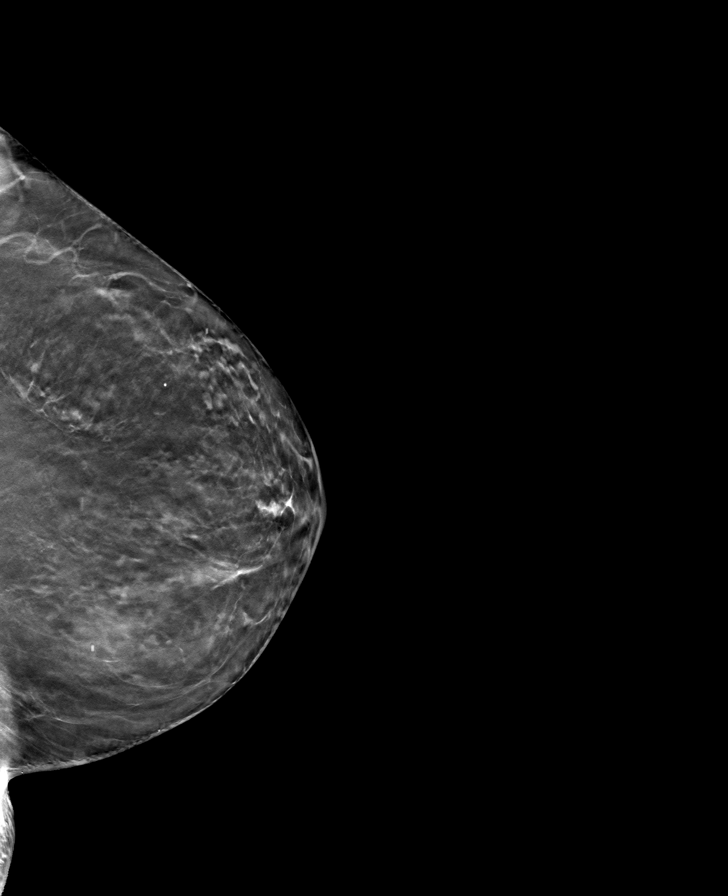

[R CC tomo · tomo slice 32/63.0]
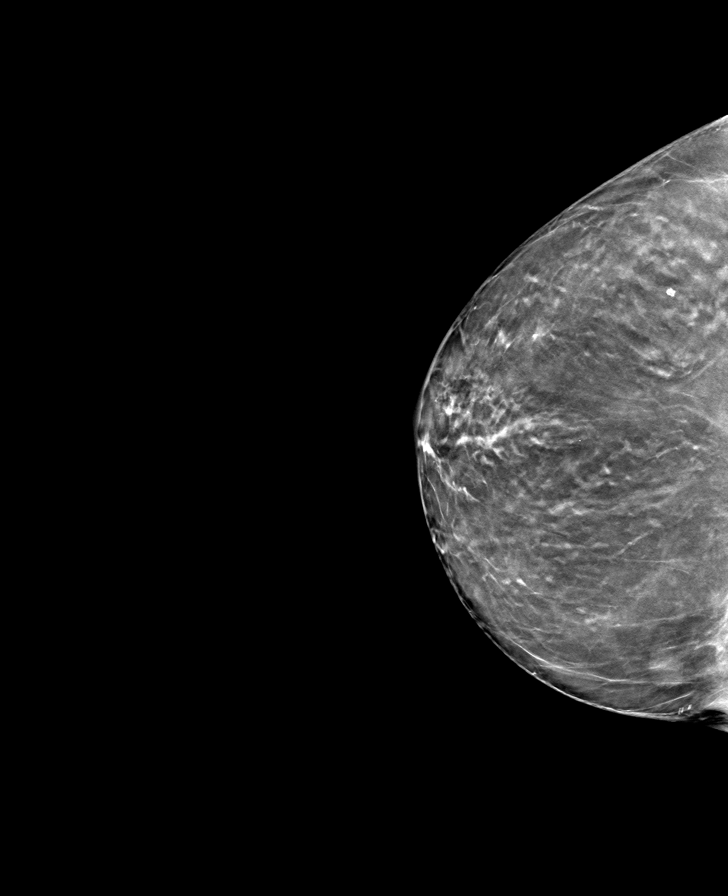

[L MLO tomo · tomo slice 36/71.0]
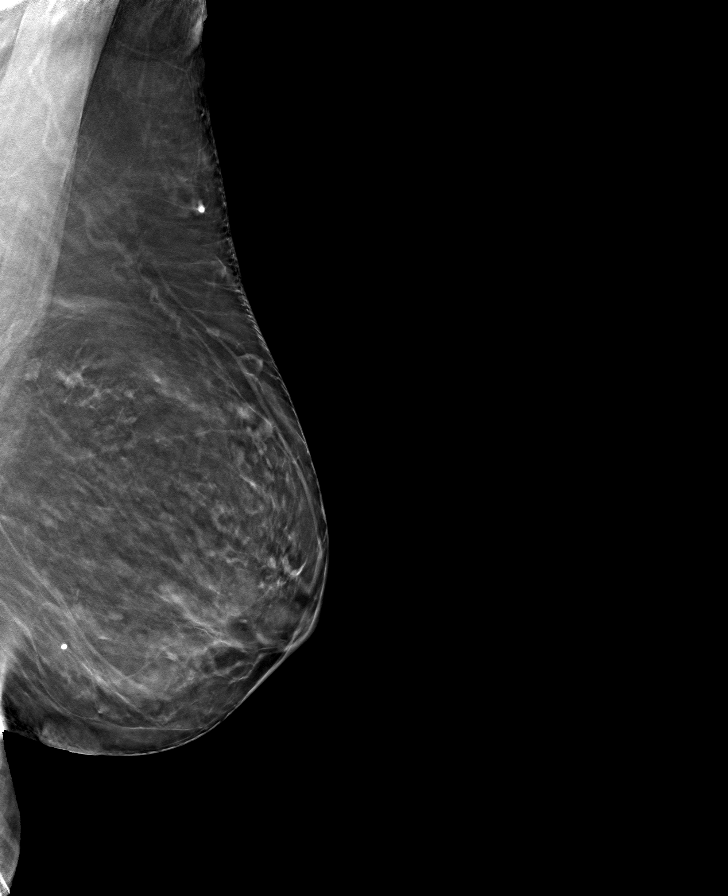

[R MLO tomo · tomo slice 34/67.0]
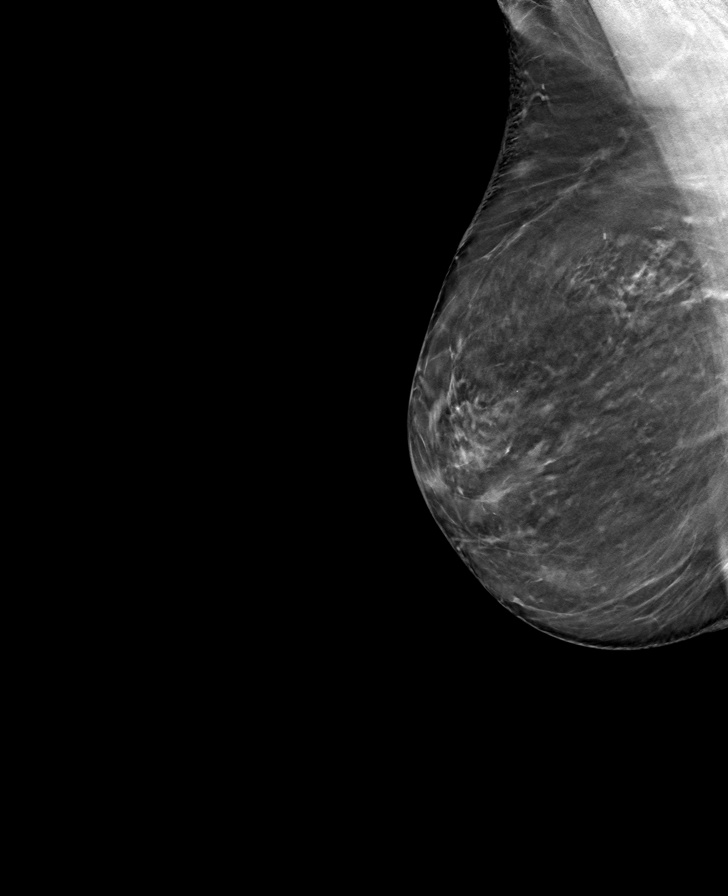

[8 of 24 positions shown; findings below may reference images not displayed]

ACR Breast Density Category b: There are scattered areas of
fibroglandular density.
FINDINGS: There are no findings suspicious for malignancy. Images were
processed with CAD.
IMPRESSION: No mammographic evidence of malignancy. A result letter of this
screening mammogram will be mailed directly to the patient.

RECOMMENDATION:
Screening mammogram in one year. (Code:[id number])

BI-RADS CATEGORY  1: Negative.

## 2021-06-27 ENCOUNTER — Other Ambulatory Visit: Payer: Self-pay | Admitting: Family Medicine

## 2021-06-27 DIAGNOSIS — Z1231 Encounter for screening mammogram for malignant neoplasm of breast: Secondary | ICD-10-CM

## 2021-07-30 ENCOUNTER — Ambulatory Visit
Admission: RE | Admit: 2021-07-30 | Discharge: 2021-07-30 | Disposition: A | Discharge status: Home / Self Care | Payer: Medicare Other | Source: Ambulatory Visit | Attending: Family Medicine | Admitting: Family Medicine

## 2021-07-30 DIAGNOSIS — Z1231 Encounter for screening mammogram for malignant neoplasm of breast: Secondary | ICD-10-CM

## 2021-09-20 LAB — POCT GLYCOSYLATED HEMOGLOBIN (HGB A1C): Hemoglobin-A1c: 6.1

## 2021-09-20 LAB — GLUCOSE, POCT (MANUAL RESULT ENTRY): POC Glucose: 118 mg/dl — AB (ref 70–99)

## 2021-10-21 ENCOUNTER — Other Ambulatory Visit: Payer: Self-pay | Admitting: Family Medicine

## 2021-10-21 DIAGNOSIS — M8580 Other specified disorders of bone density and structure, unspecified site: Secondary | ICD-10-CM

## 2022-02-13 ENCOUNTER — Ambulatory Visit
Admission: RE | Admit: 2022-02-13 | Discharge: 2022-02-13 | Disposition: A | Discharge status: Home / Self Care | Payer: Medicare Other | Source: Ambulatory Visit | Attending: Family Medicine | Admitting: Family Medicine

## 2022-02-13 ENCOUNTER — Other Ambulatory Visit: Payer: Self-pay | Admitting: Family Medicine

## 2022-02-13 DIAGNOSIS — M25552 Pain in left hip: Secondary | ICD-10-CM

## 2022-03-07 ENCOUNTER — Other Ambulatory Visit: Payer: Medicare Other

## 2022-06-24 ENCOUNTER — Other Ambulatory Visit: Payer: Self-pay | Admitting: Family Medicine

## 2022-06-24 DIAGNOSIS — Z1231 Encounter for screening mammogram for malignant neoplasm of breast: Secondary | ICD-10-CM

## 2022-07-30 ENCOUNTER — Ambulatory Visit: Admission: RE | Admit: 2022-07-30 | Payer: Medicare Other | Source: Ambulatory Visit

## 2022-07-30 DIAGNOSIS — M8580 Other specified disorders of bone density and structure, unspecified site: Secondary | ICD-10-CM

## 2022-08-01 ENCOUNTER — Ambulatory Visit
Admission: RE | Admit: 2022-08-01 | Discharge: 2022-08-01 | Disposition: A | Discharge status: Home / Self Care | Payer: Medicare Other | Source: Ambulatory Visit | Attending: Family Medicine | Admitting: Family Medicine

## 2022-08-01 DIAGNOSIS — Z1231 Encounter for screening mammogram for malignant neoplasm of breast: Secondary | ICD-10-CM

## 2023-06-26 ENCOUNTER — Other Ambulatory Visit: Payer: Self-pay | Admitting: Family Medicine

## 2023-06-26 DIAGNOSIS — Z1231 Encounter for screening mammogram for malignant neoplasm of breast: Secondary | ICD-10-CM

## 2023-08-03 ENCOUNTER — Ambulatory Visit
Admission: RE | Admit: 2023-08-03 | Discharge: 2023-08-03 | Disposition: A | Discharge status: Home / Self Care | Source: Ambulatory Visit | Attending: Family Medicine | Admitting: Family Medicine

## 2023-08-03 DIAGNOSIS — Z1231 Encounter for screening mammogram for malignant neoplasm of breast: Secondary | ICD-10-CM
